# Patient Record
Sex: Female | Born: 1965 | Race: Black or African American | Hispanic: No | Marital: Married | Smoking: Never smoker
Health system: Southern US, Community
[De-identification: ages and names within clinical notes are randomized; demographics above are authoritative.]

## PROBLEM LIST (undated history)

## (undated) DIAGNOSIS — D573 Sickle-cell trait: Secondary | ICD-10-CM

## (undated) DIAGNOSIS — L309 Dermatitis, unspecified: Secondary | ICD-10-CM

## (undated) DIAGNOSIS — I639 Cerebral infarction, unspecified: Secondary | ICD-10-CM

## (undated) HISTORY — DX: Sickle-cell trait: D57.3

## (undated) HISTORY — DX: Dermatitis, unspecified: L30.9

---

## 1997-10-11 ENCOUNTER — Inpatient Hospital Stay (HOSPITAL_COMMUNITY): Admission: AD | Admit: 1997-10-11 | Discharge: 1997-10-13 | Payer: Self-pay | Admitting: *Deleted

## 1998-03-27 ENCOUNTER — Encounter: Admission: RE | Admit: 1998-03-27 | Discharge: 1998-03-27 | Payer: Self-pay | Admitting: Family Medicine

## 1998-03-29 ENCOUNTER — Encounter: Admission: RE | Admit: 1998-03-29 | Discharge: 1998-03-29 | Payer: Self-pay | Admitting: Family Medicine

## 1998-04-11 ENCOUNTER — Encounter: Admission: RE | Admit: 1998-04-11 | Discharge: 1998-04-11 | Payer: Self-pay | Admitting: Family Medicine

## 1998-10-19 ENCOUNTER — Encounter: Admission: RE | Admit: 1998-10-19 | Discharge: 1998-10-19 | Payer: Self-pay | Admitting: Family Medicine

## 1999-04-10 ENCOUNTER — Encounter: Admission: RE | Admit: 1999-04-10 | Discharge: 1999-04-10 | Payer: Self-pay | Admitting: Sports Medicine

## 1999-06-04 HISTORY — PX: CHOLECYSTECTOMY: SHX55

## 2000-06-10 ENCOUNTER — Encounter: Admission: RE | Admit: 2000-06-10 | Discharge: 2000-06-10 | Payer: Self-pay | Admitting: Family Medicine

## 2000-07-28 ENCOUNTER — Encounter: Payer: Self-pay | Admitting: Emergency Medicine

## 2000-07-28 ENCOUNTER — Emergency Department (HOSPITAL_COMMUNITY): Admission: EM | Admit: 2000-07-28 | Discharge: 2000-07-28 | Payer: Self-pay | Admitting: Emergency Medicine

## 2000-07-31 ENCOUNTER — Encounter (INDEPENDENT_AMBULATORY_CARE_PROVIDER_SITE_OTHER): Payer: Self-pay | Admitting: Specialist

## 2000-08-01 ENCOUNTER — Inpatient Hospital Stay (HOSPITAL_COMMUNITY): Admission: RE | Admit: 2000-08-01 | Discharge: 2000-08-02 | Payer: Self-pay | Admitting: Surgery

## 2003-03-24 ENCOUNTER — Ambulatory Visit (HOSPITAL_COMMUNITY): Admission: RE | Admit: 2003-03-24 | Discharge: 2003-03-24 | Payer: Self-pay | Admitting: *Deleted

## 2004-10-10 ENCOUNTER — Ambulatory Visit (HOSPITAL_COMMUNITY): Admission: RE | Admit: 2004-10-10 | Discharge: 2004-10-10 | Payer: Self-pay | Admitting: Family Medicine

## 2004-10-10 ENCOUNTER — Ambulatory Visit: Payer: Self-pay | Admitting: Family Medicine

## 2005-11-22 ENCOUNTER — Emergency Department (HOSPITAL_COMMUNITY): Admission: EM | Admit: 2005-11-22 | Discharge: 2005-11-22 | Payer: Self-pay | Admitting: Emergency Medicine

## 2005-12-06 ENCOUNTER — Ambulatory Visit: Payer: Self-pay | Admitting: Family Medicine

## 2005-12-10 ENCOUNTER — Encounter: Admission: RE | Admit: 2005-12-10 | Discharge: 2005-12-10 | Payer: Self-pay | Admitting: Sports Medicine

## 2006-02-10 ENCOUNTER — Ambulatory Visit: Payer: Self-pay | Admitting: Family Medicine

## 2006-02-24 ENCOUNTER — Encounter: Admission: RE | Admit: 2006-02-24 | Discharge: 2006-03-17 | Payer: Self-pay | Admitting: Family Medicine

## 2006-07-31 DIAGNOSIS — L309 Dermatitis, unspecified: Secondary | ICD-10-CM

## 2006-08-06 ENCOUNTER — Telehealth: Payer: Self-pay | Admitting: *Deleted

## 2006-08-07 ENCOUNTER — Ambulatory Visit: Payer: Self-pay | Admitting: Family Medicine

## 2007-09-08 ENCOUNTER — Encounter: Admission: RE | Admit: 2007-09-08 | Discharge: 2007-09-08 | Payer: Self-pay | Admitting: Internal Medicine

## 2007-09-24 ENCOUNTER — Encounter: Admission: RE | Admit: 2007-09-24 | Discharge: 2007-09-24 | Payer: Self-pay | Admitting: Internal Medicine

## 2009-01-19 ENCOUNTER — Ambulatory Visit (HOSPITAL_COMMUNITY): Admission: RE | Admit: 2009-01-19 | Discharge: 2009-01-19 | Payer: Self-pay | Admitting: Family Medicine

## 2009-01-19 ENCOUNTER — Ambulatory Visit: Payer: Self-pay | Admitting: Family Medicine

## 2009-01-19 ENCOUNTER — Encounter: Payer: Self-pay | Admitting: Family Medicine

## 2009-01-19 DIAGNOSIS — R002 Palpitations: Secondary | ICD-10-CM | POA: Insufficient documentation

## 2009-01-31 ENCOUNTER — Telehealth: Payer: Self-pay | Admitting: Family Medicine

## 2009-02-01 ENCOUNTER — Ambulatory Visit: Payer: Self-pay | Admitting: Family Medicine

## 2009-02-02 ENCOUNTER — Ambulatory Visit: Payer: Self-pay | Admitting: Family Medicine

## 2009-02-02 DIAGNOSIS — E669 Obesity, unspecified: Secondary | ICD-10-CM | POA: Insufficient documentation

## 2009-02-02 DIAGNOSIS — D509 Iron deficiency anemia, unspecified: Secondary | ICD-10-CM | POA: Insufficient documentation

## 2009-05-11 ENCOUNTER — Ambulatory Visit: Payer: Self-pay | Admitting: Family Medicine

## 2009-05-25 ENCOUNTER — Encounter: Payer: Self-pay | Admitting: Family Medicine

## 2009-08-17 ENCOUNTER — Telehealth: Payer: Self-pay | Admitting: Family Medicine

## 2009-10-25 ENCOUNTER — Encounter: Payer: Self-pay | Admitting: Family Medicine

## 2009-11-01 ENCOUNTER — Encounter: Payer: Self-pay | Admitting: Family Medicine

## 2009-12-28 ENCOUNTER — Ambulatory Visit: Payer: Self-pay | Admitting: Family Medicine

## 2009-12-28 ENCOUNTER — Encounter: Payer: Self-pay | Admitting: Family Medicine

## 2009-12-28 ENCOUNTER — Ambulatory Visit (HOSPITAL_COMMUNITY): Admission: RE | Admit: 2009-12-28 | Discharge: 2009-12-28 | Payer: Self-pay | Admitting: Family Medicine

## 2010-07-01 LAB — CONVERTED CEMR LAB
BUN: 7 mg/dL (ref 6–23)
CO2: 24 meq/L (ref 19–32)
Calcium: 9 mg/dL (ref 8.4–10.5)
Chloride: 107 meq/L (ref 96–112)
Creatinine, Ser: 0.65 mg/dL (ref 0.40–1.20)
Glucose, Bld: 88 mg/dL (ref 70–99)
HCT: 33.4 % — ABNORMAL LOW (ref 36.0–46.0)
Hemoglobin: 10.6 g/dL — ABNORMAL LOW (ref 12.0–15.0)
MCHC: 31.7 g/dL (ref 30.0–36.0)
MCV: 70 fL — ABNORMAL LOW (ref 78.0–100.0)
Platelets: 313 10*3/uL (ref 150–400)
Potassium: 3.8 meq/L (ref 3.5–5.3)
RBC: 4.77 M/uL (ref 3.87–5.11)
RDW: 16.3 % — ABNORMAL HIGH (ref 11.5–15.5)
Sodium: 141 meq/L (ref 135–145)
TSH: 2.336 microintl units/mL (ref 0.350–4.500)
WBC: 6.9 10*3/uL (ref 4.0–10.5)

## 2010-07-05 NOTE — Assessment & Plan Note (Signed)
Summary: heart flutters,df   Vital Signs:  Patient profile:   45 year old female Height:      63 inches Weight:      247 pounds BMI:     43.91 Temp:     98.2 degrees F oral Pulse rate:   88 / minute BP sitting:   152 / 79  (left arm) Cuff size:   regular  Vitals Entered By: Jimmy Footman, CMA (December 28, 2009 3:45 PM) CC: heart flutters Is Patient Diabetic? No Pain Assessment Patient in pain? no        Primary Provider:  Helane Rima MD  CC:  heart flutters.  History of Present Illness: CC: 1) pre-op evaluation for oral surgery tomorrow, Friday 12/29/09, 2) heart flutters, 3) CP  HPI:  Pt has multiple complaints today.  She is mostly concerned about being cleared for oral surgery scheduled for tomorrow at [facility].  She has been experiencing heart palpitations that began one month ago, lasts 2-3 seconds, and resolves after sitting down.  Describes the flutter as a "wave" like sensation.  She does drink sodas regularly.  CP began one month ago and has occurred  ~3x in past month.  Located in L sternal border.  Pain is worse with exertion and walking up stairs.  The pain is sharp and lasts about 3-4 seconds.  Radiates to lower back and stomach.  Does not occur after eating large meals and denies reflux.  Pt has recently been seen by an optometrist and was dx with papillary edema and increased ICP.  Pt wants to know if her CP is related to her papillary edema.  ROS:  Positive for chest pain, SOB on exertion.  Negative for HA, nausea/vomiting, diaphoresis.  Negative for HA, cough, or fever.  Habits & Providers  Alcohol-Tobacco-Diet     Tobacco Status: never  Problems Prior to Update: 1)  Screening For Malignant Neoplasm of The Cervix  (ICD-V76.2) 2)  Obesity, Unspecified  (ICD-278.00) 3)  Contraceptive Management  (ICD-V25.09) 4)  Anemia  (ICD-285.9) 5)  Palpitations  (ICD-785.1) 6)  Eczema, Atopic Dermatitis  (ICD-691.8)  Current Problems (verified): 1)  Screening For  Malignant Neoplasm of The Cervix  (ICD-V76.2) 2)  Obesity, Unspecified  (ICD-278.00) 3)  Contraceptive Management  (ICD-V25.09) 4)  Anemia  (ICD-285.9) 5)  Palpitations  (ICD-785.1) 6)  Eczema, Atopic Dermatitis  (ICD-691.8)  Medications Prior to Update: 1)  Triamcinolone Acetonide 0.1 % Crea (Triamcinolone Acetonide) .... Apply To Aa Two Times A Day As Needed Eczema. Large Op 2)  Ferrous Sulfate 325 (65 Fe) Mg  Tbec (Ferrous Sulfate) .... Two Times A Day 3)  Colace 100 Mg Caps (Docusate Sodium) .Marland Kitchen.. 1 By Mouth 2 Times Daily As Needed For Constipation  Current Medications (verified): 1)  Triamcinolone Acetonide 0.1 % Crea (Triamcinolone Acetonide) .... Apply To Aa Two Times A Day As Needed Eczema. Large Op 2)  Ferrous Sulfate 325 (65 Fe) Mg  Tbec (Ferrous Sulfate) .... Two Times A Day  Allergies (verified): No Known Drug Allergies  Review of Systems       per hpi  Physical Exam  Eyes:  No corneal or conjunctival inflammation noted. EOMI. Perrla. Funduscopic exam benign.  No papilledema seen today. Lungs:  Normal respiratory effort, chest expands symmetrically. Lungs are clear to auscultation, no crackles or wheezes. Heart:  Normal rate and regular rhythm. S1 and S2 normal without gallop, murmur, click, rub or other extra sounds.   Impression & Recommendations:  Problem #  1:  PALPITATIONS (ICD-785.1) Assessment Unchanged Obtained an EKG for both heart palpitations and surgical clearance.  Discussed results of EKG with patient -- NSR with no q waves, no ST depression or elevaton.  Gave pt a copy of EKG to bring to surgery tomorrow.  Advised pt to cut back on caffeine intake which is a common cause of heart flutters.  Advised patient to return to clinic or follow up with PCP if palpitations become more frequent or become worse.  Precepted with Dr. Cathey Endow who recommended starting a beta blocker to help control her palpitations and elevated blood pressure.   BP reading today:  152/79  Orders: 12 Lead EKG (12 Lead EKG) [facility]- Est Level  3 (16109)  Problem # 2:  CHEST PAIN, ATYPICAL (ICD-786.59) Assessment: New Discussed with patient that her CP is atypical and does not appear to be life-threatening.  Obtained EKG which was NSR and did not show any ST changes (see Assessment #1).  Advised pt of red flags for heart attack -- excessive sweating, sharp pain that lasts longer than a few minutes, radiates to jaw and shoulder, associated with N/V -- and if she experiences these symptoms, to immediately go to ED.  Pt understood and will follow-up with PCP regarding CP and newly diagnosed papillary edema.  Will need to fasting labwork done prior to next CPE.  Orders: [facility]- Est Level  3 (60454)  Problem # 3:  Preventive Health Care (ICD-V70.0) Assessment: Comment Only  Orders: [facility]- Est Level  3 (09811)  Complete Medication List: 1)  Triamcinolone Acetonide 0.1 % Crea (Triamcinolone acetonide) .... Apply to aa two times a day as needed eczema. large op 2)  Ferrous Sulfate 325 (65 Fe) Mg Tbec (Ferrous sulfate) .... Two times a day  Other Orders: EKG- [facility] (EKG)  Patient Instructions: 1)  Please lower caffeine intake to help reduce heart palpitations.  If symptoms become worse, please return to clinic. 2)  Please follow up with PCP to re-evaluate blood pressure, chest discomfort, and palpitations. 3)  Please see laboratory to get fasting blood work done before your next appointment. 4)  If you experience chest pain that occurs with sweating, pain that radiates to your jaw or arms, nausea or vomiting, please go to the Emergency Dept.

## 2010-07-05 NOTE — Assessment & Plan Note (Signed)
Summary: f/up,tcb   Vital Signs:  Patient profile:   45 year old female Height:      63 inches Weight:      232.4 pounds BMI:     41.32 Pulse rate:   68 / minute BP sitting:   110 / 70  (right arm)  Vitals Entered By: Arlyss Repress CMA, (February 02, 2009 1:45 PM) CC: f/up headache. feels fine today. refill triamcinolone cream Is Patient Diabetic? No Pain Assessment Patient in pain? no        Primary Care Provider:  Helane Rima MD  CC:  f/up headache. feels fine today. refill triamcinolone cream.  History of Present Illness: 45 year old AAF   1. Atopic Dermatitis: controlled with triamcinolone cream, requests refill. uses sparingly.   2. Anemia: since childhood per patient. Hgb 10.6, mcv 70 last month. not taking iron supplements. menses described as very light, LMP 7/9. has IUD. denies melena, BRBPR. diet full of greens, she does not eat much red meat. she endorses fatigue, intermittent palpitations and lightheadedness.  3. Contraception: states that IUD overdue to be taken out. scheduled visit for 9/10 to remove.  4. Health Maintenance: last mammogram spring of this year, [facility]'s [facility]ogram and have records faxed to [facility]. Last pap done last year, this is due. Had recent BMP, CBC. Will make future order for FLP.   5. HA: seen yesterday for HA. today, resolved. the patient believes that it is due to known impacted L mandibular wisdom tooth. pain controlled with Aleve, but the patient wanted to make sure that nothing else was causing the pain.  Habits & Providers  Alcohol-Tobacco-Diet     Tobacco Status: never  Current Medications (verified): 1)  Triamcinolone Acetonide 0.1 % Crea (Triamcinolone Acetonide) .... Apply To Aa Two Times A Day As Needed Eczema. Large Op 2)  Ferrous Sulfate 325 (65 Fe) Mg  Tbec (Ferrous Sulfate) .... Two Times A Day 3)  Colace 100 Mg Caps (Docusate Sodium) .Marland Kitchen.. 1 By Mouth 2 Times Daily As Needed  For Constipation  Allergies (verified): No Known Drug Allergies  Review of Systems       per HPI, otherwise negative  Physical Exam  General:  Well-developed, well-nourished, in no acute distress; alert, appropriate and cooperative throughout examination. Vital signs reviewed. Lungs:  Normal respiratory effort, chest expands symmetrically. Lungs are clear to auscultation, no crackles or wheezes. Heart:  Normal rate and regular rhythm. S1 and S2 normal without gallop, murmur, click, rub or other extra sounds. Pulses:  2 + dp. Extremities:  No edema. Skin:  No eczema rashes identified today.   Impression & Recommendations:  Problem # 1:  ECZEMA, ATOPIC DERMATITIS (ICD-691.8) Assessment Improved  Controlled. Will refill medication.  Her updated medication list for this problem includes:    Triamcinolone Acetonide 0.1 % Crea (Triamcinolone acetonide) .Marland Kitchen... Apply to aa two times a day as needed eczema. large op  Orders: [facility]- Est Level  3 (16109)  Problem # 2:  ANEMIA (ICD-285.9) Assessment: New  Rx FeSO4 supplements. No overt bleed identified. Will follow closely.  Her updated medication list for this problem includes:    Ferrous Sulfate 325 (65 Fe) Mg Tbec (Ferrous sulfate) .Marland Kitchen..Marland Kitchen Two times a day  Orders: [facility]- Est Level  3 (60454)  Problem # 3:  CONTRACEPTIVE MANAGEMENT (ICD-V25.09)  Will remove IUD at next visit.  Orders: [facility]- Est Level  3 (09811)  Problem # 4:  Preventive  Health Care (ICD-V70.0) FLP at next visit. Patient advised to come fasting.  Complete Medication List: 1)  Triamcinolone Acetonide 0.1 % Crea (Triamcinolone acetonide) .... Apply to aa two times a day as needed eczema. large op 2)  Ferrous Sulfate 325 (65 Fe) Mg Tbec (Ferrous sulfate) .... Two times a day 3)  Colace 100 Mg Caps (Docusate sodium) .Marland Kitchen.. 1 by mouth 2 times daily as needed for constipation  Other Orders: T-Lipid Profile ([id number]) Future Orders: Lipid-[facility] ([id number]) ...  02/09/2010  Patient Instructions: 1)  It was very nice to meet you today! 2)  I'll see you on the 10th! 3)  Sometimes, iron supplements can cause constipation, so I recommend colace as needed for constipation. Of course, eat lots of fruits and vegetables as well! Prescriptions: COLACE 100 MG CAPS (DOCUSATE SODIUM) 1 by mouth 2 times daily as needed for constipation  #180 x 3   Entered and Authorized by:   Helane Rima MD   Signed by:   Helane Rima MD on 02/02/2009   Method used:   Print then Give to Patient   RxID:   [id number] FERROUS SULFATE 325 (65 FE) MG  TBEC (FERROUS SULFATE) two times a day  #200 x 3   Entered and Authorized by:   Helane Rima MD   Signed by:   Helane Rima MD on 02/02/2009   Method used:   Print then Give to Patient   RxID:   [id number] TRIAMCINOLONE ACETONIDE 0.1 % CREA (TRIAMCINOLONE ACETONIDE) apply to AA two times a day as needed eczema. large OP  #1 large tub x 2   Entered and Authorized by:   Helane Rima MD   Signed by:   Helane Rima MD on 02/02/2009   Method used:   Print then Give to Patient   RxID:   [id number] TRIAMCINOLONE ACETONIDE 0.1 % CREA (TRIAMCINOLONE ACETONIDE) apply to AA two times a day as needed eczema. large OP  #1 large tub x 2   Entered and Authorized by:   Helane Rima MD   Signed by:   Helane Rima MD on 02/02/2009   Method used:   Electronically to        [facility]  [address] [facility] [id number] (retail)       [address] [place]  [address]       [place], [place]  [address]       Ph: [phone] or [phone]       Fax: [phone]   RxID:   [id number] COLACE 100 MG CAPS (DOCUSATE SODIUM) 1 by mouth 2 times daily as needed for constipation  #180 x 3   Entered and Authorized by:   Helane Rima MD   Signed by:   Helane Rima MD on 02/02/2009   Method used:   Electronically to        [facility]  [id number] (retail)       [address] [place]  [address]        [place], [place]  [address]       Ph: [phone] or [phone]       Fax: [phone]   RxID:   [id number] FERROUS SULFATE 325 (65 FE) MG  TBEC (FERROUS SULFATE) two times a day  #200 x 3   Entered and Authorized by:   Helane Rima MD   Signed by:   Helane Rima MD on 02/02/2009  Method used:   Electronically to        [facility]  [address] [facility] [id number] (retail)       [address] [place]  [address]       [place], [place]  [address]       Ph: [phone] or [phone]       Fax: [phone]   RxID:   [id number]   Prevention & Chronic Care Immunizations   Influenza vaccine: Not documented    Tetanus booster: Not documented    Pneumococcal vaccine: Not documented  Other Screening   Pap smear: Not documented   Pap smear action/deferral: Ordered  (02/02/2009)    Mammogram: Not documented   Mammogram action/deferral: Not indicated  (02/02/2009)   Smoking status: never  (02/02/2009)  Lipids   Total Cholesterol: Not documented   Lipid panel action/deferral: Lipid Panel ordered   LDL: Not documented   LDL Direct: Not documented   HDL: Not documented   Triglycerides: Not documented

## 2010-07-05 NOTE — Letter (Signed)
Summary: Results Letter  [facility]  [address]   [place], [place] [address]   Phone: [phone]  Fax: [phone]    05/25/2009  ADIAH GUERECA [address] [place], [place]  [address]  Dear Ms. Methvin,  I am happy to report that your recent PAP was normal.  Sincerely,   Helane Rima DO  Appended Document: Results Letter mailed.

## 2010-07-05 NOTE — Progress Notes (Signed)
Summary: re: mammogram/TS   Phone Note Call from Patient Call back at Home Phone [phone]   Caller: Patient Summary of Call: Pt wondering if she needs a mammorgram. Initial call taken by: Clydell Hakim,  August 17, 2009 12:00 PM  Follow-up for Phone Call        called pt. pt had mammogram 2-11 at [facility]. Wants to know, if Dr.Ronaldo Crilly had looked at the mammogram report. I told the pt, that she can sched. her mammogram at the place of her choice. Pt prefers [facility]. Number given to pt to sched. and also advised, that her old films should be send to [facility] to compare. pt agreed. fwd. to Dr.Chasitie Passey for review Follow-up by: Arlyss Repress CMA,,  August 17, 2009 4:09 PM

## 2010-07-05 NOTE — Miscellaneous (Signed)
Summary: Mammagram   Clinical Lists Changes Ms. Rumbold requests the results received from [facility] to [facility]. Abundio Miu  Oct 25, 2009 3:05 PM  Faxed copy of results per pt request to the [facility].
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
## 2010-07-05 NOTE — Progress Notes (Signed)
Summary: triage   Phone Note Call from Patient Call back at Home Phone [phone]   Caller: Patient Summary of Call: pt is having very bad headaches/migrains.  wants to come in before thursday. Initial call taken by: De Nurse,  January 31, 2009 2:23 PM  Follow-up for Phone Call        taking ibuprofen with little relief. appt tomorrow at 8:30 in work in. told her to keep later appt in week as she had other issues to discuss. told her tomorrow was for HA only. also wanted a PE & DTAP for her 45 yr old. she has not been here in over 3 yrs. told her we can take her but exams for a new pt are 3-4 weeks out. she needs this now for school. she will take her to [facility] on [address] which is where she has been going. Follow-up by: Golden Circle RN,  January 31, 2009 2:24 PM

## 2010-07-10 ENCOUNTER — Encounter: Payer: Self-pay | Admitting: *Deleted

## 2010-09-21 ENCOUNTER — Inpatient Hospital Stay (INDEPENDENT_AMBULATORY_CARE_PROVIDER_SITE_OTHER)
Admission: RE | Admit: 2010-09-21 | Discharge: 2010-09-21 | Disposition: A | Discharge status: Home / Self Care | Payer: Self-pay | Source: Ambulatory Visit | Attending: Family Medicine | Admitting: Family Medicine

## 2010-09-21 DIAGNOSIS — R6889 Other general symptoms and signs: Secondary | ICD-10-CM

## 2010-10-19 NOTE — Op Note (Signed)
[facility]  Patient:    Whitney Rose, Whitney Rose                       MRN: [id number] Proc. Date: 07/31/00 Adm. Date:  [id number] Attending:  Abigail Miyamoto A                           Operative Report  PREOPERATIVE DIAGNOSIS:   Symptomatic cholelithiasis.  POSTOPERATIVE DIAGNOSIS:  Symptomatic cholelithiasis.  OPERATION:  Laparoscopic cholecystectomy.  SURGEON: otherwise Abigail Miyamoto, M.D.  ASSISTANT:  Rose Phi. Maple Hudson, M.D.  ANESTHESIA:  General endotracheal.  ESTIMATED BLOOD LOSS:  Minimal.  DESCRIPTION OF PROCEDURE:  The patient was brought to the operating room and identified as Whitney Rose.  She was placed supine on the operating table. General anesthesia was induced.  Her abdomen was then prepped and draped in the usual sterile fashion.  Using the 15 blade, a small transverse incision was made below the umbilicus.  Incision was carried down to the fascia which was then opened with the scalpel including the small umbilical hernia defect. Next, a hemostat was used to pass through the peritoneal cavity.  A 0 Vicryl pursestring suture was then passed around the fascia opening.  The Hasson port was then placed in the opening and insufflation of the abdomen was begun. Next, an 11 mm port was placed in the patients epigastrium and two 5 mm ports were placed in the patients right flank all under direct vision.  The gallbladder was then identified and found to be quite distended.  At this point, incision was made to needle aspirate bile from the gallbladder which facilitated the ability to grasp the gallbladder.  Several adhesions from the liver surface to the abdominal wall were then taken down with the scissors. The gallbladder was then grasped and retracted above the liver bed. Dissection was then carried out at the base of the gallbladder.  The cystic duct was dissected out and clipped three times proximally and once distally and transected with  scissors.  The cystic artery and a branch were then dissected out and clipped twice proximally and once distally and transected as well.  The gallbladder was then slowly dissected free from the liver bed with the electrocautery.  Hemostasis was then appeared to be achieved in the liver bed.  A small piece of Surgicel was placed on the lower aspect of the liver bed.  Once the gallbladder was removed, it was placed in an Endosac and attempt made to pull it out with the umbilicus.  Several stones had to be moved from the wall of the gallbladder due to their size and the fascia at the umbilicus had to be opened somewhat further.  Once the gallbladder was completely removed, the 0 Vicryl at the umbilicus was then tied in place.  A separated suture was also placed at the fascia at the umbilicus.  The abdomen was again then irrigated with normal saline.  Hemostasis appeared to be achieved.  All ports were then removed under direct vision and the abdomen was deflated.  All incisions were then anesthetized with 0.25% Marcaine and then closed with 4-0 Monocryl subcuticular suture.  Steri-Strips, gauze and tape were then applied.  The patient tolerated the procedure well.  All sponge, needle and instrument counts were correct at the end of the procedure.  The patient was then extubated in the operating room and taken in stable condition to the recovery  room. DD:  07/31/00 TD:  07/31/00 Job: [id number]

## 2011-10-23 ENCOUNTER — Other Ambulatory Visit (HOSPITAL_COMMUNITY)
Admission: RE | Admit: 2011-10-23 | Discharge: 2011-10-23 | Disposition: A | Discharge status: Home / Self Care | Payer: 59 | Source: Ambulatory Visit | Attending: Family Medicine | Admitting: Family Medicine

## 2011-10-23 ENCOUNTER — Ambulatory Visit (INDEPENDENT_AMBULATORY_CARE_PROVIDER_SITE_OTHER): Payer: 59 | Admitting: Family Medicine

## 2011-10-23 ENCOUNTER — Encounter: Payer: Self-pay | Admitting: Family Medicine

## 2011-10-23 VITALS — BP 132/76 | HR 80 | Temp 98.4°F | Wt 256.0 lb

## 2011-10-23 DIAGNOSIS — M722 Plantar fascial fibromatosis: Secondary | ICD-10-CM | POA: Insufficient documentation

## 2011-10-23 DIAGNOSIS — L2089 Other atopic dermatitis: Secondary | ICD-10-CM

## 2011-10-23 DIAGNOSIS — Z0000 Encounter for general adult medical examination without abnormal findings: Secondary | ICD-10-CM

## 2011-10-23 DIAGNOSIS — Z01419 Encounter for gynecological examination (general) (routine) without abnormal findings: Secondary | ICD-10-CM | POA: Insufficient documentation

## 2011-10-23 DIAGNOSIS — D649 Anemia, unspecified: Secondary | ICD-10-CM

## 2011-10-23 DIAGNOSIS — E669 Obesity, unspecified: Secondary | ICD-10-CM

## 2011-10-23 MED ORDER — TRIAMCINOLONE ACETONIDE 0.1 % EX CREA: TOPICAL_CREAM | Freq: Two times a day (BID) | CUTANEOUS | Status: DC

## 2011-10-23 NOTE — Progress Notes (Signed)
Patient ID: Whitney Rose, female   DOB: Feb 08, 1966, 46 y.o.   MRN: [id number] Patient ID: Whitney Rose    DOB: March 22, 1966, 46 y.o.   MRN: [id number] --- Subjective:  Whitney Rose is a 46 y.o.female who presents for a routine physical exam. Her concerns include the following: - Lower extremity edema:  Noticed it 3 weeks ago, swelling after walking or standing on her feet for an extended period of time. Swelling completely improves after putting feet up. Denies any shortness of breath with activity or at rest. Denies chest pain. Denies any orthopnea or paroxysmal nocturnal dyspnea.  - Eczema: Under arms behind neck. This has been present for several years. The eczema under her arms have prevented her from using normal deodorant. Has used triamcinolone and hydrating creams in the past with good results. Denies any redness, warmth.  has some itching under arms. - Foot plantar fasciitis: In both feet. Has had it for several years. Has required injections in the past, 2 years ago. Worst with walking, worst in the morning. Has not taken any medications for it in the past.  - Would also like to be able to exercise and wants to be cleared for it. Denies any previous history of heart problems except palpitations which were due to over caffeine use. No problems since. Denies chest pain, shortness of breath. Denies any family history of sudden cardiac death or any history of MI's. No problems with joints or legs.  Gyn History:  Denies any family history of gun cancers Regular period, every 28 days, 4 days long, normal flow, Patient's last menstrual period was 10/10/2011. 5 kids, vaginal births, no c-section Last 4 years have been normal. Reports an abnormal pap when she was in her 46's. No history of STD's.  ROS: see HPI Past Medical History: reviewed and updated medications and allergies. Social History: Tobacco: denies tobacco or alcohol use.   Objective: Filed Vitals:   10/23/11 1558  BP: 132/76  Pulse:  80  Temp: 98.4 F (36.9 C)    Physical Examination:   General appearance - alert, well appearing, and in no distress Ears - bilateral TM's and external canal erythematous bilaterally, no tenderness with inspection or around external ear.  Nose - normal and patent, no erythema, discharge or polyps Mouth - mucous membranes moist, pharynx normal without lesions Neck - supple, no significant adenopathy Chest - clear to auscultation, no wheezes, rales or rhonchi, symmetric air entry Heart - normal rate, regular rhythm, normal S1, S2, no murmurs Abdomen - soft, nontender, nondistended, no masses or organomegaly Extremities - peripheral pulses normal, trace pedal edema.  Pelvic exam: normal external genitalia, vulva, vagina, cervix, uterus and adnexa. Scant bleeding occurred with sample collection.  Skin - axilla: scales and poorly defined papules. No erythema, no warmth. No evidence of infection.

## 2011-10-23 NOTE — Assessment & Plan Note (Signed)
Check fasting lipid panel. Will also check fasting glucose. Check electrolytes and LFT's.

## 2011-10-23 NOTE — Assessment & Plan Note (Addendum)
Will refill triamcinolone cream which she uses with hydrating cream. No evidence of infection on exam.

## 2011-10-23 NOTE — Assessment & Plan Note (Signed)
Mammogram information given.  PAP smear obtained since last one was in 2011.

## 2011-10-23 NOTE — Patient Instructions (Signed)
It was very nice to meet you today! For the edema, I don't think it's related to your heart. It seems to be more gravity dependent. Continue keeping your feet up and if you need it, you can try compression stockings.   For the exercising, I don't see any reason not to. If you start having shortness of breath, chest pain, stop exercising and come back to clinic.   For the mammogram, I am giving you a card to call and make an appointment.   For the pap smear results, I will send you a letter in the mail with the results.   Please come back fasting to check your lipid panel, your hemoglobin and your electrolytes and sugar.  I will also send in the referral for the foot center.   I resent a prescription for triamcinolone.

## 2011-10-23 NOTE — Assessment & Plan Note (Signed)
Repeat CBC. Patient not currently taking iron supplements.

## 2011-10-25 ENCOUNTER — Other Ambulatory Visit: Payer: 59

## 2011-10-25 DIAGNOSIS — M722 Plantar fascial fibromatosis: Secondary | ICD-10-CM

## 2011-10-25 LAB — COMPREHENSIVE METABOLIC PANEL
AST: 17 U/L (ref 0–37)
Alkaline Phosphatase: 80 U/L (ref 39–117)
BUN: 10 mg/dL (ref 6–23)
Calcium: 9.1 mg/dL (ref 8.4–10.5)
Chloride: 108 mEq/L (ref 96–112)
Creat: 0.68 mg/dL (ref 0.50–1.10)
Glucose, Bld: 81 mg/dL (ref 70–99)

## 2011-10-25 LAB — CBC WITH DIFFERENTIAL/PLATELET
Basophils Absolute: 0 10*3/uL (ref 0.0–0.1)
Basophils Relative: 0 % (ref 0–1)
Eosinophils Relative: 2 % (ref 0–5)
HCT: 33.7 % — ABNORMAL LOW (ref 36.0–46.0)
MCHC: 33.2 g/dL (ref 30.0–36.0)
MCV: 67.5 fL — ABNORMAL LOW (ref 78.0–100.0)
Monocytes Absolute: 0.5 10*3/uL (ref 0.1–1.0)
Monocytes Relative: 7 % (ref 3–12)
RDW: 16.2 % — ABNORMAL HIGH (ref 11.5–15.5)

## 2011-10-25 LAB — LIPID PANEL
Cholesterol: 142 mg/dL (ref 0–200)
HDL: 35 mg/dL — ABNORMAL LOW
LDL Cholesterol: 89 mg/dL (ref 0–99)
Total CHOL/HDL Ratio: 4.1 ratio
Triglycerides: 91 mg/dL
VLDL: 18 mg/dL (ref 0–40)

## 2011-10-25 NOTE — Progress Notes (Signed)
CMP,CBC WITH DIFF AND FLP DONE TODAY Whitney Rose 

## 2011-10-31 ENCOUNTER — Telehealth: Payer: Self-pay | Admitting: Family Medicine

## 2011-10-31 DIAGNOSIS — D649 Anemia, unspecified: Secondary | ICD-10-CM

## 2011-10-31 MED ORDER — FERROUS SULFATE 325 (65 FE) MG PO TABS: 325.0000 mg | ORAL_TABLET | Freq: Two times a day (BID) | ORAL | Status: AC

## 2011-10-31 NOTE — Telephone Encounter (Signed)
Left a message on patient's phone letting her know that her cholesterol, electrolytes and pap smear were normal. She did have evidence of anemia which she has had in the past and I will therefore send in a prescription for ferrous sulfate.

## 2013-12-20 ENCOUNTER — Ambulatory Visit (INDEPENDENT_AMBULATORY_CARE_PROVIDER_SITE_OTHER): Payer: Self-pay | Admitting: Emergency Medicine

## 2013-12-20 VITALS — BP 142/81 | HR 72 | Temp 98.2°F | Resp 16 | Ht 63.0 in | Wt 237.5 lb

## 2013-12-20 DIAGNOSIS — Z0289 Encounter for other administrative examinations: Secondary | ICD-10-CM

## 2013-12-20 NOTE — Progress Notes (Signed)
[facility] [address], [place] [id number] [phone]  Date:  12/20/2013   Name:  Whitney GlassmanKatoya Y Rose   DOB:  May 02, 1966   MRN:  [id number]  PCP:  Joanna Pufforsey, Crystal S, MD    Chief Complaint: Annual Exam   History of Present Illness:  Whitney Rose is a 48 y.o. very pleasant female patient who presents with the following:  preemployment examination.   No current medical problems   Patient Active Problem List   Diagnosis Date Noted  . Plantar fasciitis 10/23/2011  . Health care maintenance 10/23/2011  . OBESITY, UNSPECIFIED 02/02/2009  . ANEMIA 02/02/2009  . PALPITATIONS 01/19/2009  . ECZEMA, ATOPIC DERMATITIS 07/31/2006    Past Medical History  Diagnosis Date  . Sickle cell trait   . Eczema     Past Surgical History  Procedure Laterality Date  . Cholecystectomy  2001    History  Substance Use Topics  . Smoking status: Never Smoker   . Smokeless tobacco: Never Used  . Alcohol Use: No    Family History  Problem Relation Age of Onset  . Heart disease Mother     CHF  . COPD Mother     No Known Allergies  Medication list has been reviewed and updated.  Current Outpatient Prescriptions on File Prior to Visit  Medication Sig Dispense Refill  . triamcinolone cream (KENALOG) 0.1 % Apply topically 2 (two) times daily. To affected areas as needed  2268 g  1   No current facility-administered medications on file prior to visit.    Review of Systems:  As per HPI, otherwise negative.    Physical Examination: Filed Vitals:   12/20/13 2055  BP: 142/81  Pulse: 72  Temp: 98.2 F (36.8 C)  Resp: 16   Filed Vitals:   12/20/13 2055  Height: 5\' 3"  (1.6 m)  Weight: 237 lb 8 oz (107.729 kg)   Body mass index is 42.08 kg/(m^2). Ideal Body Weight: Weight in (lb) to have BMI = 25: 140.8  GEN: WDWN, NAD, Non-toxic, A & O x 3 HEENT: Atraumatic, Normocephalic. Neck supple. No masses, No LAD. Ears and Nose: No external deformity. CV:  RRR, No M/G/R. No JVD. No thrill. No extra heart sounds. PULM: CTA B, no wheezes, crackles, rhonchi. No retractions. No resp. distress. No accessory muscle use. ABD: S, NT, ND, +BS. No rebound. No HSM. EXTR: No c/c/e NEURO Normal gait.  PSYCH: Normally interactive. Conversant. Not depressed or anxious appearing.  Calm demeanor.    Assessment and Plan: Pre-employment  Physical Labs   Signed,  Phillips OdorJeffery Jayvian Escoe, MD

## 2013-12-22 LAB — MEASLES/MUMPS/RUBELLA IMMUNITY
Mumps IgG: >300 AU/mL — ABNORMAL HIGH (ref <9.00)
RUBELLA: 5.12 {index} — AB (ref <0.90)
Rubeola IgG: >300 AU/mL — ABNORMAL HIGH (ref <25.00)

## 2013-12-22 LAB — VARICELLA ZOSTER ANTIBODY, IGG: Varicella IgG: 1004 Index — ABNORMAL HIGH (ref <135.00)

## 2013-12-24 ENCOUNTER — Encounter: Payer: Self-pay | Admitting: Family Medicine

## 2014-11-15 ENCOUNTER — Ambulatory Visit: Payer: 59 | Attending: Internal Medicine

## 2014-12-09 ENCOUNTER — Ambulatory Visit: Payer: 59

## 2015-03-01 ENCOUNTER — Encounter (HOSPITAL_COMMUNITY): Payer: Self-pay | Admitting: *Deleted

## 2015-03-01 ENCOUNTER — Emergency Department (HOSPITAL_COMMUNITY): Payer: 59

## 2015-03-01 ENCOUNTER — Emergency Department (HOSPITAL_COMMUNITY)
Admission: EM | Admit: 2015-03-01 | Discharge: 2015-03-01 | Disposition: A | Discharge status: Home / Self Care | Payer: 59 | Attending: Emergency Medicine | Admitting: Emergency Medicine

## 2015-03-01 DIAGNOSIS — F419 Anxiety disorder, unspecified: Secondary | ICD-10-CM | POA: Insufficient documentation

## 2015-03-01 DIAGNOSIS — R002 Palpitations: Secondary | ICD-10-CM | POA: Insufficient documentation

## 2015-03-01 DIAGNOSIS — Z872 Personal history of diseases of the skin and subcutaneous tissue: Secondary | ICD-10-CM | POA: Insufficient documentation

## 2015-03-01 DIAGNOSIS — Z862 Personal history of diseases of the blood and blood-forming organs and certain disorders involving the immune mechanism: Secondary | ICD-10-CM | POA: Insufficient documentation

## 2015-03-01 DIAGNOSIS — Z8673 Personal history of transient ischemic attack (TIA), and cerebral infarction without residual deficits: Secondary | ICD-10-CM | POA: Insufficient documentation

## 2015-03-01 HISTORY — DX: Cerebral infarction, unspecified: I63.9

## 2015-03-01 LAB — BASIC METABOLIC PANEL
Anion gap: 7 (ref 5–15)
BUN: 6 mg/dL (ref 6–20)
CALCIUM: 8.7 mg/dL — AB (ref 8.9–10.3)
CHLORIDE: 105 mmol/L (ref 101–111)
CO2: 25 mmol/L (ref 22–32)
CREATININE: 0.69 mg/dL (ref 0.44–1.00)
GFR calc Af Amer: >60 mL/min (ref >60)
GFR calc non Af Amer: >60 mL/min (ref >60)
Glucose, Bld: 87 mg/dL (ref 65–99)
Potassium: 3.5 mmol/L (ref 3.5–5.1)
SODIUM: 137 mmol/L (ref 135–145)

## 2015-03-01 LAB — I-STAT TROPONIN, ED: TROPONIN I, POC: 0.02 ng/mL (ref 0.00–0.08)

## 2015-03-01 LAB — CBC
HCT: 32.3 % — ABNORMAL LOW (ref 36.0–46.0)
Hemoglobin: 10.5 g/dL — ABNORMAL LOW (ref 12.0–15.0)
MCH: 22.7 pg — AB (ref 26.0–34.0)
MCHC: 32.5 g/dL (ref 30.0–36.0)
MCV: 69.8 fL — AB (ref 78.0–100.0)
PLATELETS: 343 10*3/uL (ref 150–400)
RBC: 4.63 MIL/uL (ref 3.87–5.11)
RDW: 17.1 % — AB (ref 11.5–15.5)
WBC: 9 10*3/uL (ref 4.0–10.5)

## 2015-03-01 MED ORDER — TRIAMCINOLONE ACETONIDE 0.1 % EX CREA: TOPICAL_CREAM | Freq: Two times a day (BID) | CUTANEOUS | Status: DC

## 2015-03-01 MED ORDER — ATORVASTATIN CALCIUM 80 MG PO TABS: 80.0000 mg | ORAL_TABLET | Freq: Every day | ORAL | Status: DC

## 2015-03-01 NOTE — Discharge Instructions (Signed)
Continue ASA 81 mg daily.  Continue your other medicines.   See wellness clinic for follow up.  Return to ER if you have heart rate greater than 100, shortness of breath, chest pain.

## 2015-03-01 NOTE — ED Notes (Signed)
The pt is c/o an irregular heart beat for 3 days with a dullness in her chest  lmp  Sept 15.  Stroke in July  Residual lt finger tips only

## 2015-03-01 NOTE — ED Provider Notes (Signed)
CSN: [id number]     Arrival date & time 03/01/15  2151 History   First MD Initiated Contact with Patient 03/01/15 2201     Chief Complaint  Patient presents with  . Irregular Heart Beat     (Consider location/radiation/quality/duration/timing/severity/associated sxs/prior Treatment) The history is provided by the patient.  Whitney Rose is a 49 y.o. female hx of sickle cell trait, recent stroke here with irregular heartbeat. Has had intermittent palpitations for the last several days. States that it lasts for several minutes and goes away. Has some dull aches in her chest but denies any chest pain or shortness of breath. Patient was in [place] in early July and was admitted to [facility] for stroke. At that time, patient has left hand numbness as well as weakness the left arm. She had an admission with MRI that shows right sided stroke. She has a normal TTE and TEE and was started on aspirin, statin. No history of blood clots in the past. She had been workup in the past for palpitations but never found any causes. Of note, patient was noted to be slightly anemic with a hemoglobin around 10. She did have heavy menses about 2 weeks ago. But she is also taking iron and denies any dizziness or passing out.    Past Medical History  Diagnosis Date  . Sickle cell trait   . Eczema   . Stroke    Past Surgical History  Procedure Laterality Date  . Cholecystectomy  2001   Family History  Problem Relation Age of Onset  . Heart disease Mother     CHF  . COPD Mother    Social History  Substance Use Topics  . Smoking status: Never Smoker   . Smokeless tobacco: Never Used  . Alcohol Use: No   OB History    No data available     Review of Systems  Cardiovascular: Positive for palpitations.  All other systems reviewed and are negative.     Allergies  Review of patient's allergies indicates no known allergies.  Home Medications   Prior to Admission medications    Medication Sig Start Date End Date Taking? Authorizing Provider  triamcinolone cream (KENALOG) 0.1 % Apply topically 2 (two) times daily. To affected areas as needed 10/23/11   Lonia Skinner, MD   BP 124/45 mmHg  Pulse 73  Temp(Src) 98.5 F (36.9 C) (Oral)  Resp 18  SpO2 100%  LMP 02/17/2015 Physical Exam  Constitutional: She is oriented to person, place, and time. She appears well-developed and well-nourished.  Slightly anxious   HENT:  Head: Normocephalic.  Mouth/Throat: Oropharynx is clear and moist.  Eyes: Conjunctivae are normal. Pupils are equal, round, and reactive to light.  Neck: Normal range of motion. Neck supple.  Cardiovascular: Normal rate, regular rhythm and normal heart sounds.   Pulmonary/Chest: Effort normal and breath sounds normal. No respiratory distress. She has no wheezes. She has no rales.  Abdominal: Soft. Bowel sounds are normal. She exhibits no distension. There is no tenderness. There is no rebound.  Musculoskeletal: Normal range of motion. She exhibits no edema or tenderness.  Neurological: She is alert and oriented to person, place, and time. No cranial nerve deficit. Coordination normal.  Skin: Skin is warm and dry.  Psychiatric: She has a normal mood and affect. Her behavior is normal. Thought content normal.  Nursing note and vitals reviewed.   ED Course  Procedures (including critical care time) Labs Review Labs  Reviewed  BASIC METABOLIC PANEL - Abnormal; Notable for the following:    Calcium 8.7 (*)    All other components within normal limits  CBC - Abnormal; Notable for the following:    Hemoglobin 10.5 (*)    HCT 32.3 (*)    MCV 69.8 (*)    MCH 22.7 (*)    RDW 17.1 (*)    All other components within normal limits  Rosezena Sensor, ED    Imaging Review Dg Chest 2 View  03/01/2015   CLINICAL DATA:  Chest palpitations and discomfort.  EXAM: CHEST  2 VIEW  COMPARISON:  None.  FINDINGS: The mediastinal contour is normal. The heart  size is enlarged. There is no focal infiltrate, pulmonary edema, or pleural effusion. The visualized skeletal structures are unremarkable.  IMPRESSION: No active cardiopulmonary disease.   Electronically Signed   By: Sherian Rein M.D.   On: 03/01/2015 22:39   I have personally reviewed and evaluated these images and lab results as part of my medical decision-making.   EKG Interpretation   Date/Time:  Wednesday March 01 2015 21:58:39 EDT Ventricular Rate:  73 PR Interval:  152 QRS Duration: 90 QT Interval:  396 QTC Calculation: 436 R Axis:   35 Text Interpretation:  Normal sinus rhythm Normal ECG No significant change  since last tracing Confirmed by YAO  MD, DAVID ([id number]) on 03/01/2015  10:01:40 PM      MDM   Final diagnoses:  None   Whitney Rose is a 49 y.o. female here with palpitations. Not tachy in the ED and no shortness of breath. She did come back from [place] recently but has no signs of PE or DVT. Had recent echo that was unremarkable in [place]. Will check Hg to r/o symptomatic anemia. Will get CXR. Likely a component of anxiety.   11:25 PM Hg 10.5, improved compared to July. CXR nl. Trop neg. Had several episodes of palpitations in the ED but HR remained in the 60s and no obvious arrhythmias noted on the monitor. Able to get her follow up in the clinic. Stable for dc.    Richardean Canal, MD 03/01/15 2329

## 2015-03-09 ENCOUNTER — Encounter: Payer: Self-pay | Admitting: Family Medicine

## 2015-03-09 ENCOUNTER — Ambulatory Visit: Payer: 59 | Attending: Family Medicine | Admitting: Family Medicine

## 2015-03-09 VITALS — BP 146/80 | HR 68 | Temp 98.4°F | Resp 18 | Ht 63.0 in | Wt 246.0 lb

## 2015-03-09 DIAGNOSIS — I638 Other cerebral infarction: Secondary | ICD-10-CM | POA: Diagnosis not present

## 2015-03-09 DIAGNOSIS — IMO0001 Reserved for inherently not codable concepts without codable children: Secondary | ICD-10-CM

## 2015-03-09 DIAGNOSIS — R002 Palpitations: Secondary | ICD-10-CM

## 2015-03-09 DIAGNOSIS — L309 Dermatitis, unspecified: Secondary | ICD-10-CM

## 2015-03-09 DIAGNOSIS — R030 Elevated blood-pressure reading, without diagnosis of hypertension: Secondary | ICD-10-CM | POA: Diagnosis not present

## 2015-03-09 DIAGNOSIS — I6389 Other cerebral infarction: Secondary | ICD-10-CM

## 2015-03-09 DIAGNOSIS — Z8673 Personal history of transient ischemic attack (TIA), and cerebral infarction without residual deficits: Secondary | ICD-10-CM | POA: Insufficient documentation

## 2015-03-09 MED ORDER — ATORVASTATIN CALCIUM 80 MG PO TABS: 80.0000 mg | ORAL_TABLET | Freq: Every day | ORAL | Status: DC

## 2015-03-09 NOTE — Progress Notes (Signed)
Pt's here For ED f/up for for stroke and heart palpitations on July 3rd, of this year. Pt reports no pain today. Pt would like to est. care with PCP.

## 2015-03-09 NOTE — Patient Instructions (Signed)

## 2015-03-09 NOTE — Progress Notes (Signed)
CC:  HPI: Whitney Rose is a 49 y.o. female with a history of eczema, recent stroke (in 12/2014) who presents to the clinic today to establish care.  She had presented to the ED at [facility] a week ago with palpitations, vital signs were normal and chest x-ray was unremarkable. Her hemoglobin was 10.5 and troponins were negative; EKG was negative for arrhythmias and she was subsequently discharged and advised to follow-up here in the clinic.  She states palpitations are infrequent and inconsistent and not precipitated by any event. She had similar symptoms in [place] 3 months ago and wore a Holter monitor also 3 weeks with no recorded abnormal events. Of note she was diagnosed with a stroke in 12/2014 when she had presented with left-sided weakness and facial droop and MRI revealed right-sided stroke and she was admitted to [facility] where she was commenced on statin therapy as well as aspirin  Patient has No headache, No chest pain, No abdominal pain - No Nausea, No new weakness tingling or numbness, No Cough - SOB.  No Known Allergies Past Medical History  Diagnosis Date  . Sickle cell trait (HCC)   . Eczema   . Stroke [facility])    Current Outpatient Prescriptions on File Prior to Visit  Medication Sig Dispense Refill  . triamcinolone cream (KENALOG) 0.1 % Apply topically 2 (two) times daily. To affected areas as needed 453.6 g 1   No current facility-administered medications on file prior to visit.   Family History  Problem Relation Age of Onset  . Heart disease Mother     CHF  . COPD Mother    Social History   Social History  . Marital Status: Married    Spouse Name: N/A  . Number of Children: N/A  . Years of Education: N/A   Occupational History  . Not on file.   Social History Main Topics  . Smoking status: Never Smoker   . Smokeless tobacco: Never Used  . Alcohol Use: No  . Drug Use: No  . Sexual Activity: Yes   Other Topics Concern  . Not  on file   Social History Narrative    Review of Systems: Constitutional: Negative for fever, chills, diaphoresis, activity change, appetite change and fatigue. HENT: Negative for ear pain, nosebleeds, congestion, facial swelling, rhinorrhea, neck pain, neck stiffness and ear discharge.  Eyes: Negative for pain, discharge, redness, itching and visual disturbance. Respiratory: Negative for cough, choking, chest tightness, shortness of breath, wheezing and stridor.  Cardiovascular: Negative for chest pain, positive for palpitations and negative for leg swelling. Gastrointestinal: Negative for abdominal distention. Genitourinary: Negative for dysuria, urgency, frequency, hematuria, flank pain, decreased urine volume, difficulty urinating and dyspareunia.  Musculoskeletal: Negative for back pain, joint swelling, arthralgias and gait problem. Neurological: Negative for dizziness, tremors, seizures, syncope, facial asymmetry, speech difficulty, weakness, light-headedness, numbness and headaches.  Hematological: Negative for adenopathy. Does not bruise/bleed easily. Psychiatric/Behavioral: Negative for hallucinations, behavioral problems, confusion, dysphoric mood, decreased concentration and agitation.    Objective: Filed Vitals:   03/09/15 1148 03/09/15 1203  BP: 145/79 146/80  Pulse: 68   Temp: 98.4 F (36.9 C)   TempSrc: Oral   Resp: 18   Height:  (1.6 m)   Weight: 246 lb (111.585 kg)   SpO2: 97%         Physical Exam: Constitutional: Patient appears well-developed and well-nourished. No distress. HENT: Normocephalic, atraumatic, External right and left ear normal. Oropharynx is clear  and moist.  Eyes: Conjunctivae and EOM are normal. PERRLA, no scleral icterus. Neck: Normal ROM. Neck supple. No JVD. No tracheal deviation. No thyromegaly. CVS: RRR, S1/S2 +, no murmurs, no gallops, no carotid bruit.  Pulmonary: Effort and breath sounds normal, no stridor, rhonchi, wheezes,  rales.  Abdominal: Soft. BS +,  no distension, tenderness, rebound or guarding.  Musculoskeletal: Normal range of motion. No edema and no tenderness.  Lymphadenopathy: No lymphadenopathy noted, cervical, inguinal or axillary Neuro: Alert. Normal reflexes, muscle tone coordination. No cranial nerve deficit. Skin: Skin is warm and dry. No rash noted. Not diaphoretic. No erythema. No pallor. Psychiatric: Normal mood and affect. Behavior, judgment, thought content normal.  Lab Results  Component Value Date   WBC 9.0 03/01/2015   HGB 10.5* 03/01/2015   HCT 32.3* 03/01/2015   MCV 69.8* 03/01/2015   PLT 343 03/01/2015   Lab Results  Component Value Date   CREATININE 0.69 03/01/2015   BUN 6 03/01/2015   NA 137 03/01/2015   K 3.5 03/01/2015   CL 105 03/01/2015   CO2 25 03/01/2015    No results found for: HGBA1C Lipid Panel     Component Value Date/Time   CHOL 142 10/25/2011 1143   TRIG 91 10/25/2011 1143   HDL 35* 10/25/2011 1143   CHOLHDL 4.1 10/25/2011 1143   VLDL 18 10/25/2011 1143   LDLCALC 89 10/25/2011 1143       Assessment and plan:  49 year old female with a history of a eczema, stroke with intermittent episodes of palpitations; blood pressure is elevated with no known history of hypertension.  Previous history of stroke: No residual deficit. Aggressive risk factor modification. Continue Lipitor.  Eczema: Controlled on triamcinolone.  Elevated blood pressure: I have reviewed her previous blood pressures which have been normal. Advised on lifestyle changes including weight loss, low-sodium and DASH diet and her blood pressure will be reassessed at her next office visit.  Palpitations: Previous workup in [facility] including Holter monitor all negative. I will send off a thyroid function test and if still symptomatic she will be referred to Cardiology-Dr. Daleen Squibb.    This note has been created with Education officer, environmental. Any  transcriptional errors are unintentional.     Jaclyn Shaggy, MD. [facility] [phone] 03/09/2015, 12:30 PM

## 2015-03-10 LAB — TSH: TSH: 1.988 u[IU]/mL (ref 0.350–4.500)

## 2015-03-15 ENCOUNTER — Ambulatory Visit: Payer: 59

## 2015-03-23 ENCOUNTER — Encounter: Payer: Self-pay | Admitting: Family Medicine

## 2015-03-23 ENCOUNTER — Ambulatory Visit: Payer: 59 | Attending: Family Medicine | Admitting: Family Medicine

## 2015-03-23 VITALS — BP 138/77 | HR 75 | Temp 98.3°F | Resp 16 | Ht 63.0 in | Wt 244.0 lb

## 2015-03-23 DIAGNOSIS — D573 Sickle-cell trait: Secondary | ICD-10-CM | POA: Insufficient documentation

## 2015-03-23 DIAGNOSIS — L309 Dermatitis, unspecified: Secondary | ICD-10-CM

## 2015-03-23 DIAGNOSIS — Z8673 Personal history of transient ischemic attack (TIA), and cerebral infarction without residual deficits: Secondary | ICD-10-CM | POA: Insufficient documentation

## 2015-03-23 DIAGNOSIS — R197 Diarrhea, unspecified: Secondary | ICD-10-CM

## 2015-03-23 DIAGNOSIS — R002 Palpitations: Secondary | ICD-10-CM | POA: Diagnosis not present

## 2015-03-23 DIAGNOSIS — Z79899 Other long term (current) drug therapy: Secondary | ICD-10-CM | POA: Insufficient documentation

## 2015-03-23 DIAGNOSIS — I638 Other cerebral infarction: Secondary | ICD-10-CM | POA: Diagnosis not present

## 2015-03-23 DIAGNOSIS — I6389 Other cerebral infarction: Secondary | ICD-10-CM

## 2015-03-23 MED ORDER — ATORVASTATIN CALCIUM 40 MG PO TABS: 40.0000 mg | ORAL_TABLET | Freq: Every day | ORAL | Status: DC

## 2015-03-23 NOTE — Patient Instructions (Signed)

## 2015-03-23 NOTE — Progress Notes (Signed)
Subjective:    Patient ID: Whitney Rose, female    DOB: [birth year], 49 y.o.   MRN: [id number]  HPI Whitney Rose is a 49 year old female with a history of TIA/?stroke (with no residual deficits and was placed on Lipitor for secondary prevention) who recently relocated from [facility] and is here to follow-up on palpitations which she had complained of at her last office visit .  Palpitations have since stopped but today she is complaining about dark discoloration of her lips and diarrhea of up to 3 times a day which she attributes to the Lipitor she is taking because Prior to taking Lipitor she never had such symptoms. She denies ingestion of new foods or use of new creams.  She would like to try a reduced dose of Lipitor to see if there will be improvement in symptoms.   Her blood pressure was also elevated at last office visit and she had no previous diagnosis of hypertension  but it has normalized today.   Past Medical History  Diagnosis Date  . Sickle cell trait (HCC)   . Eczema   . Stroke [facility])     Past Surgical History  Procedure Laterality Date  . Cholecystectomy  2001    No Known Allergies  Social History   Social History  . Marital Status: Married    Spouse Name: N/A  . Number of Children: N/A  . Years of Education: N/A   Occupational History  . Not on file.   Social History Main Topics  . Smoking status: Never Smoker   . Smokeless tobacco: Never Used  . Alcohol Use: No  . Drug Use: No  . Sexual Activity: Yes   Other Topics Concern  . Not on file   Social History Narrative    Current Outpatient Prescriptions on File Prior to Visit  Medication Sig Dispense Refill  . triamcinolone cream (KENALOG) 0.1 % Apply topically 2 (two) times daily. To affected areas as needed 453.6 g 1  . pseudoephedrine-acetaminophen (TYLENOL SINUS) 30-500 MG TABS tablet Take 1 tablet by mouth every 4 (four) hours as needed.     No current facility-administered medications  on file prior to visit.      Review of Systems Constitutional: Negative for fever, chills, diaphoresis, activity change, appetite change and fatigue. HENT: Negative for ear pain, nosebleeds, congestion, facial swelling, rhinorrhea, neck pain, neck stiffness and ear discharge.  Eyes: Negative for pain, discharge, redness, itching and visual disturbance. Respiratory: Negative for cough, choking, chest tightness, shortness of breath, wheezing and stridor.  Cardiovascular: Negative for chest pain, positive for palpitations and negative for leg swelling. Gastrointestinal: Negative for abdominal distention. Genitourinary: Negative for dysuria, urgency, frequency, hematuria, flank pain, decreased urine volume, difficulty urinating and dyspareunia.  Musculoskeletal: Negative for back pain, joint swelling, arthralgias and gait problem. Neurological: Negative for dizziness, tremors, seizures, syncope, facial asymmetry, speech difficulty, weakness, light-headedness, numbness and headaches.  Skin: See history of present illness  Hematological: Negative for adenopathy. Does not bruise/bleed easily. Psychiatric/Behavioral: Negative for hallucinations, behavioral problems, confusion, dysphoric mood, decreased concentration and agitation.       Objective: Filed Vitals:   03/23/15 0918  BP: 138/77  Pulse: 75  Temp: 98.3 F (36.8 C)  Resp: 16      Physical Exam Constitutional: Patient appears well-developed and well-nourished. No distress. Neck: Normal ROM. Neck supple. No JVD. No tracheal deviation. No thyromegaly. CVS: RRR, S1/S2 +, no murmurs, no gallops, no carotid bruit.  Pulmonary: Effort and  breath sounds normal, no stridor, rhonchi, wheezes, rales.  Abdominal: Soft. BS +,  no distension, tenderness, rebound or guarding.  Musculoskeletal: Normal range of motion. No edema and no tenderness.  Lymphadenopathy: No lymphadenopathy noted, cervical, inguinal or axillary Neuro: Alert. Normal  reflexes, muscle tone coordination. No cranial nerve deficit. Skin: Skin is warm and dry. Eczematous rash noted on palms and on dorsum of both feet which is dry and scaly with associated lichenification.  Psychiatric: Normal mood and affect. Behavior, judgment, thought content normal.      Assessment & Plan:  49 year old female with a history of a eczema, stroke/TIA  with intermittent episodes of palpitations;   Previous history of stroke: No residual deficit. Aggressive risk factor modification. Continuereduced dose of Lipitor and we will monitor the dark discoloration of her lips.  Diarrhea: Patient is of the opinion that this is secondary to her Lipitor and so I am reducing Lipitor from 80 mg to 40 mg. She is denying infectious etiology and is refusing a workup of a possible etiologies She also knows to inform us in the event her symptoms become bothersome and embarrassing so we can place on antimotility agents which she is refusing at this time.  Eczema: Currently on triamcinolone. Discussed skin care and the use of fragrance free body washes and body lotions.  Palpitations: Normal heart function. Resolved.  Advised to come in to get established and to have her annual wellness exam.     This note has been created with Education officer, environmentalDragon speech recognition software and smart phrase technology. Any transcriptional errors are unintentional.

## 2015-03-23 NOTE — Progress Notes (Signed)
Pt's here f/u for palpitation. Pt's reports feelings well.   Pt states that her lips are turning black since starting lipitor 07/16 after having TIA.  Offered Pt Flu vaccine, but pt declined.

## 2015-03-24 ENCOUNTER — Ambulatory Visit: Payer: 59 | Attending: Internal Medicine

## 2015-04-24 ENCOUNTER — Ambulatory Visit: Payer: 59 | Admitting: Family Medicine

## 2015-05-18 ENCOUNTER — Telehealth: Payer: Self-pay | Admitting: Family Medicine

## 2015-05-18 NOTE — Telephone Encounter (Signed)
Pt. Called requesting a med refill for Lipitor. Please f/u with pt.

## 2015-05-19 NOTE — Telephone Encounter (Signed)
Left HIPAA compliant message for patient on her cell.  Her home number is not active.  Patient should have refills of this medication already.  Patient has appointment on 05/26/15.

## 2015-05-26 ENCOUNTER — Ambulatory Visit: Payer: 59 | Admitting: Family Medicine

## 2015-06-01 ENCOUNTER — Other Ambulatory Visit: Payer: Self-pay | Admitting: *Deleted

## 2015-06-01 MED ORDER — ATORVASTATIN CALCIUM 40 MG PO TABS: 40.0000 mg | ORAL_TABLET | Freq: Every day | ORAL | Status: DC

## 2015-06-01 NOTE — Telephone Encounter (Signed)
Patient calling in asking for refill on her Lipitor.  Explained to patient that she was to have come in in November to establish care and discuss with the MD how the new Lipitor dosage was working out.  Patient states she does not have insurance and the new dose is working fine.  I will give her a 1 month supply of Lipitor to [facility] pharmacy and I made her an appointment to establish care on January 9th at 11:30.  Patient verbalized understanding.  She was also told about our financial counselors that could help her obtain the [facility] discount.

## 2015-06-12 ENCOUNTER — Ambulatory Visit: Payer: 59 | Admitting: Family Medicine

## 2015-07-03 ENCOUNTER — Ambulatory Visit: Payer: 59 | Admitting: Family Medicine

## 2018-03-18 ENCOUNTER — Inpatient Hospital Stay (HOSPITAL_COMMUNITY): Payer: Medicaid - Out of State

## 2018-03-18 ENCOUNTER — Inpatient Hospital Stay (HOSPITAL_COMMUNITY)
Admission: EM | Admit: 2018-03-18 | Discharge: 2018-03-19 | DRG: 062 | Disposition: A | Discharge status: Home Health | Payer: Medicaid - Out of State | Attending: Neurology | Admitting: Neurology

## 2018-03-18 ENCOUNTER — Emergency Department (HOSPITAL_COMMUNITY): Payer: Medicaid - Out of State

## 2018-03-18 DIAGNOSIS — R4701 Aphasia: Secondary | ICD-10-CM | POA: Diagnosis present

## 2018-03-18 DIAGNOSIS — I63511 Cerebral infarction due to unspecified occlusion or stenosis of right middle cerebral artery: Secondary | ICD-10-CM | POA: Diagnosis present

## 2018-03-18 DIAGNOSIS — I639 Cerebral infarction, unspecified: Secondary | ICD-10-CM

## 2018-03-18 DIAGNOSIS — I10 Essential (primary) hypertension: Secondary | ICD-10-CM | POA: Diagnosis present

## 2018-03-18 DIAGNOSIS — I69354 Hemiplegia and hemiparesis following cerebral infarction affecting left non-dominant side: Secondary | ICD-10-CM

## 2018-03-18 DIAGNOSIS — D509 Iron deficiency anemia, unspecified: Secondary | ICD-10-CM

## 2018-03-18 DIAGNOSIS — E876 Hypokalemia: Secondary | ICD-10-CM | POA: Diagnosis present

## 2018-03-18 DIAGNOSIS — I5030 Unspecified diastolic (congestive) heart failure: Secondary | ICD-10-CM

## 2018-03-18 DIAGNOSIS — R778 Other specified abnormalities of plasma proteins: Secondary | ICD-10-CM

## 2018-03-18 DIAGNOSIS — D573 Sickle-cell trait: Secondary | ICD-10-CM | POA: Diagnosis present

## 2018-03-18 DIAGNOSIS — Z8673 Personal history of transient ischemic attack (TIA), and cerebral infarction without residual deficits: Secondary | ICD-10-CM

## 2018-03-18 DIAGNOSIS — R569 Unspecified convulsions: Secondary | ICD-10-CM | POA: Insufficient documentation

## 2018-03-18 DIAGNOSIS — I6300 Cerebral infarction due to thrombosis of unspecified precerebral artery: Secondary | ICD-10-CM | POA: Diagnosis not present

## 2018-03-18 DIAGNOSIS — R482 Apraxia: Secondary | ICD-10-CM | POA: Diagnosis present

## 2018-03-18 DIAGNOSIS — I63412 Cerebral infarction due to embolism of left middle cerebral artery: Principal | ICD-10-CM | POA: Diagnosis present

## 2018-03-18 DIAGNOSIS — R7989 Other specified abnormal findings of blood chemistry: Secondary | ICD-10-CM

## 2018-03-18 DIAGNOSIS — I69320 Aphasia following cerebral infarction: Secondary | ICD-10-CM

## 2018-03-18 LAB — PROTIME-INR
INR: 1.07
PROTHROMBIN TIME: 13.8 s (ref 11.4–15.2)

## 2018-03-18 LAB — RAPID URINE DRUG SCREEN, HOSP PERFORMED
AMPHETAMINES: NOT DETECTED
BENZODIAZEPINES: NOT DETECTED
Barbiturates: NOT DETECTED
Cocaine: NOT DETECTED
Opiates: NOT DETECTED
Tetrahydrocannabinol: NOT DETECTED

## 2018-03-18 LAB — DIFFERENTIAL
Abs Immature Granulocytes: 0.03 10*3/uL (ref 0.00–0.07)
Basophils Absolute: 0 10*3/uL (ref 0.0–0.1)
Basophils Relative: 0 %
EOS ABS: 0.1 10*3/uL (ref 0.0–0.5)
EOS PCT: 1 %
Immature Granulocytes: 0 %
LYMPHS ABS: 3 10*3/uL (ref 0.7–4.0)
LYMPHS PCT: 32 %
MONO ABS: 0.9 10*3/uL (ref 0.1–1.0)
MONOS PCT: 9 %
NEUTROS PCT: 58 %
Neutro Abs: 5.3 10*3/uL (ref 1.7–7.7)

## 2018-03-18 LAB — HEMOGLOBIN A1C
HEMOGLOBIN A1C: 5.5 % (ref 4.8–5.6)
Mean Plasma Glucose: 111.15 mg/dL

## 2018-03-18 LAB — I-STAT CHEM 8, ED
BUN: 11 mg/dL (ref 6–20)
CREATININE: 1 mg/dL (ref 0.44–1.00)
Calcium, Ion: 1.22 mmol/L (ref 1.15–1.40)
Chloride: 109 mmol/L (ref 98–111)
Glucose, Bld: 85 mg/dL (ref 70–99)
HEMATOCRIT: 34 % — AB (ref 36.0–46.0)
HEMOGLOBIN: 11.6 g/dL — AB (ref 12.0–15.0)
POTASSIUM: 3.1 mmol/L — AB (ref 3.5–5.1)
SODIUM: 145 mmol/L (ref 135–145)
TCO2: 23 mmol/L (ref 22–32)

## 2018-03-18 LAB — CBC
HCT: 33.5 % — ABNORMAL LOW (ref 36.0–46.0)
HCT: 27.9 % — ABNORMAL LOW (ref 36.0–46.0)
HEMOGLOBIN: 8.1 g/dL — AB (ref 12.0–15.0)
Hemoglobin: 9.8 g/dL — ABNORMAL LOW (ref 12.0–15.0)
MCH: 20.5 pg — AB (ref 26.0–34.0)
MCH: 20.8 pg — ABNORMAL LOW (ref 26.0–34.0)
MCHC: 29.3 g/dL — ABNORMAL LOW (ref 30.0–36.0)
MCHC: 29 g/dL — ABNORMAL LOW (ref 30.0–36.0)
MCV: 69.9 fL — AB (ref 80.0–100.0)
MCV: 71.7 fL — AB (ref 80.0–100.0)
PLATELETS: 308 10*3/uL (ref 150–400)
Platelets: 291 10*3/uL (ref 150–400)
RBC: 4.79 MIL/uL (ref 3.87–5.11)
RBC: 3.89 MIL/uL (ref 3.87–5.11)
RDW: 18.9 % — AB (ref 11.5–15.5)
RDW: 19 % — ABNORMAL HIGH (ref 11.5–15.5)
WBC: 9.3 10*3/uL (ref 4.0–10.5)
WBC: 9.8 10*3/uL (ref 4.0–10.5)
nRBC: 0 % (ref 0.0–0.2)
nRBC: 0 % (ref 0.0–0.2)

## 2018-03-18 LAB — ETHANOL: Alcohol, Ethyl (B): <10 mg/dL (ref <10)

## 2018-03-18 LAB — I-STAT TROPONIN, ED: Troponin i, poc: 0.1 ng/mL (ref 0.00–0.08)

## 2018-03-18 LAB — LIPID PANEL
Cholesterol: 99 mg/dL (ref 0–200)
HDL: 40 mg/dL — ABNORMAL LOW (ref >40)
LDL CALC: 46 mg/dL (ref 0–99)
Total CHOL/HDL Ratio: 2.5 RATIO
Triglycerides: 67 mg/dL (ref <150)
VLDL: 13 mg/dL (ref 0–40)

## 2018-03-18 LAB — COMPREHENSIVE METABOLIC PANEL
ALK PHOS: 86 U/L (ref 38–126)
ALT: 37 U/L (ref 0–44)
ANION GAP: 11 (ref 5–15)
AST: 34 U/L (ref 15–41)
Albumin: 3.4 g/dL — ABNORMAL LOW (ref 3.5–5.0)
BILIRUBIN TOTAL: 0.6 mg/dL (ref 0.3–1.2)
BUN: 11 mg/dL (ref 6–20)
CALCIUM: 9.5 mg/dL (ref 8.9–10.3)
CO2: 22 mmol/L (ref 22–32)
Chloride: 109 mmol/L (ref 98–111)
Creatinine, Ser: 1.03 mg/dL — ABNORMAL HIGH (ref 0.44–1.00)
Glucose, Bld: 91 mg/dL (ref 70–99)
POTASSIUM: 3.1 mmol/L — AB (ref 3.5–5.1)
Sodium: 142 mmol/L (ref 135–145)
Total Protein: 7.5 g/dL (ref 6.5–8.1)

## 2018-03-18 LAB — HIV ANTIBODY (ROUTINE TESTING W REFLEX): HIV SCREEN 4TH GENERATION: NONREACTIVE

## 2018-03-18 LAB — ECHOCARDIOGRAM COMPLETE

## 2018-03-18 LAB — URINALYSIS, ROUTINE W REFLEX MICROSCOPIC
BILIRUBIN URINE: NEGATIVE
Glucose, UA: NEGATIVE mg/dL
HGB URINE DIPSTICK: NEGATIVE
Ketones, ur: NEGATIVE mg/dL
Leukocytes, UA: NEGATIVE
Nitrite: NEGATIVE
PH: 6 (ref 5.0–8.0)
Protein, ur: NEGATIVE mg/dL
SPECIFIC GRAVITY, URINE: 1.021 (ref 1.005–1.030)

## 2018-03-18 LAB — APTT: APTT: 30 s (ref 24–36)

## 2018-03-18 LAB — I-STAT BETA HCG BLOOD, ED (MC, WL, AP ONLY): I-stat hCG, quantitative: <5 m[IU]/mL (ref <5)

## 2018-03-18 LAB — TECHNOLOGIST SMEAR REVIEW

## 2018-03-18 LAB — MRSA PCR SCREENING: MRSA by PCR: NEGATIVE

## 2018-03-18 LAB — CBG MONITORING, ED: Glucose-Capillary: 82 mg/dL (ref 70–99)

## 2018-03-18 LAB — SAVE SMEAR(SSMR), FOR PROVIDER SLIDE REVIEW

## 2018-03-18 MED ORDER — FAMOTIDINE IN NACL 20-0.9 MG/50ML-% IV SOLN
20.0000 mg | Freq: Two times a day (BID) | INTRAVENOUS | Status: DC
  Administered 2018-03-18 (×3): 20 mg via INTRAVENOUS
  Filled 2018-03-18 (×3): qty 50

## 2018-03-18 MED ORDER — ALTEPLASE (STROKE) FULL DOSE INFUSION
0.9000 mg/kg | Freq: Once | INTRAVENOUS | Status: AC
  Administered 2018-03-18: 76.7 mg via INTRAVENOUS
  Filled 2018-03-18: qty 100

## 2018-03-18 MED ORDER — ACETAMINOPHEN 325 MG PO TABS: 650.0000 mg | ORAL_TABLET | ORAL | Status: DC | PRN

## 2018-03-18 MED ORDER — SODIUM CHLORIDE 0.9 % IV SOLN
INTRAVENOUS | Status: DC
  Administered 2018-03-18: 03:00:00 via INTRAVENOUS

## 2018-03-18 MED ORDER — STROKE: EARLY STAGES OF RECOVERY BOOK
Freq: Once | Status: AC
  Administered 2018-03-18: 04:00:00
  Filled 2018-03-18: qty 1

## 2018-03-18 MED ORDER — LORAZEPAM 2 MG/ML IJ SOLN
1.0000 mg | Freq: Once | INTRAMUSCULAR | Status: AC
  Administered 2018-03-18: 1 mg via INTRAVENOUS

## 2018-03-18 MED ORDER — LORAZEPAM 2 MG/ML IJ SOLN
INTRAMUSCULAR | Status: AC
  Administered 2018-03-18: 01:00:00
  Filled 2018-03-18: qty 1

## 2018-03-18 MED ORDER — ACETAMINOPHEN 650 MG RE SUPP: 650.0000 mg | RECTAL | Status: DC | PRN

## 2018-03-18 MED ORDER — IOPAMIDOL (ISOVUE-370) INJECTION 76%
50.0000 mL | Freq: Once | INTRAVENOUS | Status: AC | PRN
  Administered 2018-03-18: 50 mL via INTRAVENOUS

## 2018-03-18 MED ORDER — ACETAMINOPHEN 160 MG/5ML PO SOLN: 650.0000 mg | ORAL | Status: DC | PRN

## 2018-03-18 NOTE — H&P (Signed)
Neurology H&P  CC: Aphasia  History is obtained from:patient  HPI: Whitney Rose is a 52 y.o. female with a history of  Previous stroke with subsequent left arm weakness who was last know well shortly after 10pm. She  had a witnessed change with decreased speech and subsequently was brought to the ER by EMS.  On arrival it was noted that she had significant aphasia.  I discussed the risks/benefits of IV TPA and after multiple discussions, calling 3 separate family members they did agreed to proceed with IV TPA.  At baseline, she is unable to be left alone, needs assistance.  She is able to communicate and interact, however.  She is able to walk, but often drags her left leg.  She has had multiple strokes in the past treated at [facility].  She had a surgery/procedure of some type there about 1.5 years ago, though I am unclear of exactly what type of surgery was done.  A second surgery was recommended but the patient declined.  She has been maintained on Plavix.  She was grossly combative on arrival, and had to be given a total of 2 mg of Ativan to obtain imaging.  LKW: 2220 tpa given?: yes MRS: 3  ROS: A 14 point ROS was performed and is negative except as noted in the HPI.  Past Medical History:  Diagnosis Date  . Eczema   . Sickle cell trait (HCC)   . Stroke [facility])      Family History  Problem Relation Age of Onset  . Heart disease Mother        CHF  . COPD Mother      Social History:  reports that she has never smoked. She has never used smokeless tobacco. She reports that she does not drink alcohol or use drugs.   Exam: Current vital signs: Vitals:   03/18/18 0345 03/18/18 0400  BP: (!) 117/58 (!) 129/59  Pulse: (!) 57 (!) 57  Resp: 16 15  Temp:    SpO2: 100% 100%    Vital signs in last 24 hours: BP: ()/()  Arterial Line BP: ()/()   Physical Exam  Constitutional: Appears well-developed and well-nourished.  Psych: Affect appropriate to situation Eyes: No scleral  injection HENT: No OP obstrucion Head: Normocephalic.  Cardiovascular: Normal rate and regular rhythm.  Respiratory: Effort normal and breath sounds normal to anterior ascultation GI: Soft.  No distension. There is no tenderness.  Skin: WDI  Neuro: Mental Status: Patient is awake, alert, occasionally says "Please help me" but otherwise without verbal output   Cranial Nerves: II: Does not clearly blink to threat from either side. Pupils are equal, round, and reactive to light.   III,IV, VI: EOMI without ptosis or diploplia.  V: Facial sensation is symmetric to temperature VII: Facial movement is symmetric.  VIII: hearing is intact to voice X: Uvula elevates symmetrically XI: Shoulder shrug is symmetric. XII: tongue is midline without atrophy or fasciculations.  Motor: Tone is normal. Bulk is normal. 5/5 strength was present in the right arm and leg, she appears to move the left leg less than the right, but does move it against gravity.  She does not lift left arm against gravity. sensory: She responds to noxious stimulation on the right more than left. Cerebellar: FNF and HKS are intact bilaterally   I have reviewed labs in epic and the results pertinent to this consultation are: CMP-unremarkable  I have reviewed the images obtained: CT head- no acute findings, multiple  previous strokes, CTA- distal M2 bordering on M3 occlusion  Primary Diagnosis:  Cerebral infarction due to embolism of left middle cerebral artery  Secondary Diagnosis: Stroke   Impression: 52 year old female with multiple recurrent strokes of with a etiology that is unclear to me at this time.  We will need to get her records from [place].  She is here within the timeframe for IV TPA and therefore this has been administered.  Due to her impaired functional status at baseline, as well as caliber vessels in the distal location of the clot, is decided not to pursue interventional therapy.  Recommendations: -  HgbA1c, fasting lipid panel - MRI of the brain without contrast - Frequent neuro checks - Echocardiogram - Prophylactic therapy none for 24 hours- Risk factor modification - Telemetry monitoring - PT consult, OT consult, Speech consult -Obtain outside records from [place], I have placed an order to obtain these - Stroke team to follow  This patient is critically ill and at significant risk of neurological worsening, death and care requires constant monitoring of vital signs, hemodynamics,respiratory and cardiac monitoring, neurological assessment, discussion with family, other specialists and medical decision making of high complexity. I spent 60 minutes of neurocritical care time  in the care of  this patient.  Ritta Slot, MD [facility] [phone]  If 7pm- 7am, please page neurology on call as listed in AMION. 03/18/2018  1:02 AM

## 2018-03-18 NOTE — ED Notes (Signed)
Dr. Petra Kuba initially assessed pt. at arrival and transported to CT scan .

## 2018-03-18 NOTE — Evaluation (Signed)
Speech Language Pathology Evaluation Patient Details Name: Whitney Rose MRN: [id number] DOB: 06-08-1965 Today's Date: 03/18/2018 Time: 1000-1030 SLP Time Calculation (min) (ACUTE ONLY): 30 min  Problem List:  Patient Active Problem List   Diagnosis Date Noted  . Stroke (cerebrum) (HCC) 03/18/2018  . Stroke (HCC) 03/09/2015  . Plantar fasciitis 10/23/2011  . Health care maintenance 10/23/2011  . OBESITY, UNSPECIFIED 02/02/2009  . ANEMIA 02/02/2009  . PALPITATIONS 01/19/2009  . Eczema 07/31/2006   Past Medical History:  Past Medical History:  Diagnosis Date  . Eczema   . Sickle cell trait (HCC)   . Stroke [facility])    Past Surgical History:  Past Surgical History:  Procedure Laterality Date  . CHOLECYSTECTOMY  2001   HPI:  Whitney Rose is a 52 y.o. female with a history of  Previous stroke with subsequent left arm weakness who was last know well shortly after 10pm. She  had a witnessed change with decreased speech and subsequently was brought to the ER by EMS.  On arrival it was noted that she had significant aphasia.  Was given IV tPA. At baseline, she is unable to be left alone, needs assistance.  She is able to communicate and interact, however.  She is able to walk, but often drags her left leg.   Assessment / Plan / Recommendation Clinical Impression  Pt demonstrates new difficulty with expressive communication, suspected to be a primary motor speech impairment vs a phonemic aphasia. She is fluent, her pitch is flat and her volume is over-loud. Speech is characterized by frequent mis-articulations and phoneme substitutions, but not distortions. Pt is aware of errors and attempts to self correct. Errors increase with complexity of target. Despite this, pt is somewhat intelligible with basic communication as errors are often close to the phonemic/articulatory target and grammar is intact. Suspect regardless of language vs motor diagnosis, pt will rapidly improve with multimodal  cueing and pacing in functional language tasks and structured reading/repetition, branching from simple to more complex words/phrases. Pt has cognitive impairment and mild left inattention from prior CVA that required assist with complex tasks from family. Pt would be an excellent candidate for CIR given good prognosis for rapid recovery.     SLP Assessment  SLP Visit Diagnosis: Apraxia (R48.2);Aphasia (R47.01)    Follow Up Recommendations       Frequency and Duration min 3x week  2 weeks      SLP Evaluation Cognition  Overall Cognitive Status: History of cognitive impairments - at baseline Arousal/Alertness: Awake/alert Orientation Level: Oriented to person;Oriented to place;Oriented to time;Oriented to situation Attention: Sustained;Selective Sustained Attention: Appears intact Selective Attention: Appears intact Memory: Impaired Memory Impairment: Decreased recall of new information;Storage deficit(baseline, ) Awareness: Appears intact Problem Solving: Impaired Problem Solving Impairment: Verbal complex;Functional complex(baseline) Executive Function: Reasoning;Sequencing(baseline) Safety/Judgment: Appears intact       Comprehension  Auditory Comprehension Overall Auditory Comprehension: Impaired Yes/No Questions: Impaired Basic Biographical Questions: 76-100% accurate Basic Immediate Environment Questions: 75-100% accurate Complex Questions: 50-74% accurate Commands: Impaired One Step Basic Commands: 75-100% accurate Two Step Basic Commands: 75-100% accurate Multistep Basic Commands: 0-24% accurate Complex Commands: 0-24% accurate Conversation: Simple Interfering Components: Visual impairments;Processing speed;Working memory EffectiveTechniques: Repetition;Visual/Gestural cues;Extra processing time Visual Recognition/Discrimination Discrimination: Within Function Limits Reading Comprehension Reading Status: Impaired Word level: Within functional limits Sentence  Level: Impaired    Expression Verbal Expression Overall Verbal Expression: Impaired Initiation: No impairment Automatic Speech: Name;Counting;Social Response Level of Generative/Spontaneous Verbalization: Phrase;Sentence;Conversation Repetition: Impaired Level of Impairment: Word level  Naming: No impairment Non-Verbal Means of Communication: Gestures   Oral / Motor  Oral Motor/Sensory Function Overall Oral Motor/Sensory Function: Within functional limits Motor Speech Overall Motor Speech: Impaired Respiration: Within functional limits Phonation: Other (comment)(flat pitch, loud volume) Resonance: Within functional limits Articulation: Impaired Level of Impairment: Word Intelligibility: Intelligible Motor Planning: Impaired Level of Impairment: Word Motor Speech Errors: Groping for words;Inconsistent;Aware Effective Techniques: Slow rate;Pacing   GO                   Harlon Ditty, MA CCC-SLP  Acute Rehabilitation Services Pager [phone] Office [phone]  Claudine Mouton 03/18/2018, 10:58 AM

## 2018-03-18 NOTE — Progress Notes (Signed)
OT Cancellation Note  Patient Details Name: MARELLY WEHRMAN MRN: [id number] DOB: [birth year]   Cancelled Treatment:    Reason Eval/Treat Not Completed: Active bedrest order. Will continue to follow.   Chancy Milroy, OT Acute Rehabilitation Services Pager [phone] Office [phone]   Chancy Milroy 03/18/2018, 10:08 AM

## 2018-03-18 NOTE — ED Notes (Signed)
Alteplase currently infusing , Dr. Petra Kuba explained plan of care to pt.

## 2018-03-18 NOTE — Progress Notes (Signed)
LE venous duplex prelim: negative for DVT.  TCD completed.  Cyler Kappes Eunice, RDMS, RVT  

## 2018-03-18 NOTE — Procedures (Signed)
ELECTROENCEPHALOGRAM REPORT   Patient: Whitney Rose       Room #: 1O10R EEG No. ID: [id number] Age: 52 y.o.        Sex: female Referring Physician: Pearlean Brownie Report Date:  03/18/2018        Interpreting Physician: Thana Farr  History: Whitney Rose is an 52 y.o. female with history of stroke presenting with aphasia  Medications:  Pepcid  Conditions of Recording:  This is a 21 channel routine scalp EEG performed with bipolar and monopolar montages arranged in accordance to the international 10/20 system of electrode placement. One channel was dedicated to EKG recording.  The patient is in the awake and drowsy states.  Description:  Muscle and movement artifact are prominent during the recording often obscuring the background rhythm.  When able to be visualized the waking background activity consists of a low voltage, symmetrical, fairly well organized, 8 Hz alpha activity at its maximum, seen from the parieto-occipital and posterior temporal regions.  Although 8 Hz is the maximum noted this is actually poorly sustained and the posterior background rhythm is more dominantly in the theta range.  Low voltage fast activity, poorly organized, is seen anteriorly and is at times superimposed on more posterior regions.  A mixture of theta and alpha rhythms are seen from the central and temporal regions. The patient drowses with slowing to irregular, low voltage theta and beta activity.   Stage II sleep is not obtained. No epileptiform activity is noted.   Hyperventilation and intermittent photic stimulation were not performed.   IMPRESSION: Normal electroencephalogram, awake and drowsy. There are no focal lateralizing or epileptiform features.   Thana Farr, MD Neurology [phone] 03/18/2018, 1:31 PM

## 2018-03-18 NOTE — Progress Notes (Signed)
EEG completed; results pending.    

## 2018-03-18 NOTE — Evaluation (Addendum)
Physical Therapy Evaluation Patient Details Name: Whitney Rose MRN: [id number] DOB: 03/08/1966 Today's Date: 03/18/2018   History of Present Illness  Pt had stroke in 12/2014 in [place]. Since then as per daughter, she had 6 strokes all in [place]. She had admitted to [facility] medical center and [facility] in the past. P admitted with Patient arrived with EMS from home reports sudden onset altered mental status with slurred speech and left side gaze. CT head showed chronic large right MCA, small ACA and PCA infarcts as well as left small parietal infarcts. CTA head and neck showed left M2 occlusion with distal reconstitution. She received tPA and admitted to ICU.Marland Kitchen   Clinical Impression  Pt was able to walk the hallway with min guard assist today, her cognition, speech and safety are all more impaired compared to PTA.  Daughter reports someone can be with her 24/7, so home therapy would be appropriate follow up as her gait is functional.  PT to follow acutely for deficits listed below.    Follow Up Recommendations Home health PT;Supervision/Assistance - 24 hour    Equipment Recommendations  None recommended by PT    Recommendations for Other Services   NA    Precautions / Restrictions Precautions Precautions: Fall Precaution Comments: left sided weakness and right side inattention.       Mobility  Bed Mobility Overal bed mobility: Needs Assistance Bed Mobility: Supine to Sit     Supine to sit: Supervision        Transfers Overall transfer level: Needs assistance Equipment used: 2 person hand held assist Transfers: Sit to/from Stand Sit to Stand: Min assist         General transfer comment: steady support given  Ambulation/Gait Ambulation/Gait assistance: Min guard Gait Distance (Feet): 65 Feet Assistive device: IV Pole Gait Pattern/deviations: Step-to pattern;Steppage;Decreased dorsiflexion - left     General Gait Details: Pt started with one hand on  IV pole and free hand supported by therapist with one therapist on each side, but was able to progress to min guard assist from one therapist without the support of the IV pole.  She does have difficulty attending to her right side during gait.       Modified Rankin (Stroke Patients Only) Modified Rankin (Stroke Patients Only) Pre-Morbid Rankin Score: Moderate disability Modified Rankin: Moderately severe disability     Balance Overall balance assessment: Needs assistance   Sitting balance-Leahy Scale: Good     Standing balance support: Single extremity supported;No upper extremity supported Standing balance-Leahy Scale: Fair                               Pertinent Vitals/Pain Pain Assessment: No/denies pain    Home Living Family/patient expects to be discharged to:: Private residence Living Arrangements: Children Available Help at Discharge: Family;Available 24 hours/day Type of Home: House Home Access: Stairs to enter Entrance Stairs-Rails: None Entrance Stairs-Number of Steps: 1 Home Layout: Two level;Bed/bath upstairs Home Equipment: None      Prior Function Level of Independence: Needs assistance   Gait / Transfers Assistance Needed: Ambulated without assistnace; family assisted into shower  ADL's / Homemaking Assistance Needed: Pt bathed herself but required assistnace with dressing due to "putting her clothes on inside out/backwards"        Hand Dominance   Dominant Hand: Right(ambidextrious)    Extremity/Trunk Assessment      Lower Extremity Assessment Lower Extremity Assessment: LLE  deficits/detail LLE Deficits / Details: left leg with weakness during gait, L DF weakness resulting in a bit of foot drag and steppage gait pattern without buckling or hyperextension at the knee.  Per her daughter, her walking is close to baseline.      Cervical / Trunk Assessment Cervical / Trunk Assessment: Other exceptions Cervical / Trunk Exceptions:  lateral trunk difference from previous CVA  Communication   Communication: Expressive difficulties  Cognition Arousal/Alertness: Awake/alert Behavior During Therapy: Impulsive Overall Cognitive Status: Impaired/Different from baseline Area of Impairment: Attention;Safety/judgement;Awareness;Problem solving                   Current Attention Level: Selective     Safety/Judgement: Decreased awareness of safety;Decreased awareness of deficits Awareness: Emergent Problem Solving: Slow processing;Requires verbal cues               Assessment/Plan    PT Assessment Patient needs continued PT services  PT Problem List Decreased strength;Decreased activity tolerance;Decreased balance;Decreased coordination;Decreased mobility;Decreased cognition;Decreased knowledge of use of DME;Decreased safety awareness;Impaired sensation       PT Treatment Interventions DME instruction;Gait training;Stair training;Functional mobility training;Therapeutic activities;Therapeutic exercise;Balance training;Neuromuscular re-education;Cognitive remediation;Patient/family education    PT Goals (Current goals can be found in the Care Plan section)  Acute Rehab PT Goals Patient Stated Goal: per family to return home PT Goal Formulation: With family Time For Goal Achievement: 04/01/18 Potential to Achieve Goals: Good    Frequency Min 4X/week        Co-evaluation PT/OT/SLP Co-Evaluation/Treatment: Yes Reason for Co-Treatment: Complexity of the patient's impairments (multi-system involvement);Necessary to address cognition/behavior during functional activity;For patient/therapist safety;To address functional/ADL transfers PT goals addressed during session: Mobility/safety with mobility;Balance;Strengthening/ROM OT goals addressed during session: ADL's and self-care       AM-PAC PT "6 Clicks" Daily Activity  Outcome Measure Difficulty turning over in bed (including adjusting bedclothes,  sheets and blankets)?: A Little Difficulty moving from lying on back to sitting on the side of the bed? : A Little Difficulty sitting down on and standing up from a chair with arms (e.g., wheelchair, bedside commode, etc,.)?: Unable Help needed moving to and from a bed to chair (including a wheelchair)?: A Little Help needed walking in hospital room?: A Little Help needed climbing 3-5 steps with a railing? : A Little 6 Click Score: 16    End of Session Equipment Utilized During Treatment: Gait belt Activity Tolerance: Patient tolerated treatment well Patient left: in chair;with call bell/phone within reach;with family/visitor present;with chair alarm set   PT Visit Diagnosis: Muscle weakness (generalized) (M62.81);Difficulty in walking, not elsewhere classified (R26.2);Other symptoms and signs involving the nervous system (W09.811)    Time: 9147-8295 PT Time Calculation (min) (ACUTE ONLY): 30 min   Charges:          Lurena Joiner B. Glorian Mcdonell, PT, DPT  Acute Rehabilitation [phone] pager #[phone] office   PT Evaluation $PT Eval Moderate Complexity: 1 Mod

## 2018-03-18 NOTE — ED Triage Notes (Signed)
Patient arrived with EMS from home reports sudden onset altered mental status with slurred speech and left side gaze , LSN is 2320 , confused and disoriented at arrival , initially evaluated by EDP/Dr. Petra Kuba at arrival and transported to CT scan .

## 2018-03-18 NOTE — Progress Notes (Signed)
STROKE TEAM PROGRESS NOTE   SUBJECTIVE (INTERVAL HISTORY) Her daughter and speech therapist are at the bedside.  Overall she feels her condition is gradually improving.   Daughter recounted HPI with me. Pt had stroke in 12/2014 in [place]. Since then as per daughter, she had 6 strokes all in [place]. She had admitted to [facility] and [facility] in the past but did not know what the reason causing strokes. She had no HTN prior to stroke, but was told to have HTN after stroke. However, her BP at home was good. Due to stroke, she had residue of left UE weakness and memory loss and needing help for some daily activity at home. She can not write now, however, daughter said she is ambidextrous, but use more at left hand.   For the last year, she had 3-4 episodes of lose control of limbs, or eye movements, shivering with bowel movement and then weakness in whole body, not responding but did not lose conscientiousness, usually resolves after taking juice or sweets. Not tongue biting, shaking or jerking, LOC, or b/b incontinence, but every time she needs to go to bathroom right after.   Yesterday, she came out of shower, became confused, agitated and frustrated with her underwear, kept put it in and out of drawer, kept touch her feet, and got up and look around in a repetitive pattern. Not respond to command and then not talking. EMS called, on EMS arrival, she went out door and then did not know where to go and then walked back inside house, very confused.   CT head showed chronic large right MCA, small ACA and PCA infarcts as well as left small parietal infarcts. CTA head and neck showed left M2 occlusion with distal reconstitution. She received tPA and admitted to ICU.  This morning, she was able to speak sentences but replace some words with sounds, making significant paraphasic errors.  However, comprehensive intact. Chronic left UE weakness, but no other new weakness.     OBJECTIVE Temp:  [97.6 F (36.4 C)-98.4 F (36.9 C)] 97.6 F (36.4 C) (10/16 0800) Pulse Rate:  [49-84] 51 (10/16 0900) Cardiac Rhythm: Sinus bradycardia (10/16 0800) Resp:  [12-28] 12 (10/16 0900) BP: (115-159)/(40-134) 136/59 (10/16 0900) SpO2:  [100 %] 100 % (10/16 0900)  Recent Labs  Lab 03/18/18 0142  GLUCAP 82   Recent Labs  Lab 03/18/18 0117 03/18/18 0127  NA 142 145  K 3.1* 3.1*  CL 109 109  CO2 22  --   GLUCOSE 91 85  BUN 11 11  CREATININE 1.03* 1.00  CALCIUM 9.5  --    Recent Labs  Lab 03/18/18 0117  AST 34  ALT 37  ALKPHOS 86  BILITOT 0.6  PROT 7.5  ALBUMIN 3.4*   Recent Labs  Lab 03/18/18 0117 03/18/18 0127 03/18/18 0700  WBC 9.3  --  9.8  NEUTROABS 5.3  --   --   HGB 9.8* 11.6* 8.1*  HCT 33.5* 34.0* 27.9*  MCV 69.9*  --  71.7*  PLT 308  --  291   No results for input(s): CKTOTAL, CKMB, CKMBINDEX, TROPONINI in the last 168 hours. Recent Labs    03/18/18 0117  LABPROT 13.8  INR 1.07   Recent Labs    03/18/18 0147  COLORURINE STRAW*  LABSPEC 1.021  PHURINE 6.0  GLUCOSEU NEGATIVE  HGBUR NEGATIVE  BILIRUBINUR NEGATIVE  KETONESUR NEGATIVE  PROTEINUR NEGATIVE  NITRITE NEGATIVE  LEUKOCYTESUR NEGATIVE  Component Value Date/Time   CHOL 99 03/18/2018 0117   TRIG 67 03/18/2018 0117   HDL 40 (L) 03/18/2018 0117   CHOLHDL 2.5 03/18/2018 0117   VLDL 13 03/18/2018 0117   LDLCALC 46 03/18/2018 0117   Lab Results  Component Value Date   HGBA1C 5.5 03/18/2018      Component Value Date/Time   LABOPIA NONE DETECTED 03/18/2018 0117   COCAINSCRNUR NONE DETECTED 03/18/2018 0117   LABBENZ NONE DETECTED 03/18/2018 0117   AMPHETMU NONE DETECTED 03/18/2018 0117   THCU NONE DETECTED 03/18/2018 0117   LABBARB NONE DETECTED 03/18/2018 0117    Recent Labs  Lab 03/18/18 0117  ETH <10    I have personally reviewed the radiological images below and agree with the radiology interpretations.  Ct Angio Head W Or Wo  Contrast  Result Date: 03/18/2018 CLINICAL DATA:  52 y/o F; altered mental status, in character in speech, left gaze. EXAM: CT ANGIOGRAPHY HEAD AND NECK TECHNIQUE: Multidetector CT imaging of the head and neck was performed using the standard protocol during bolus administration of intravenous contrast. Multiplanar CT image reconstructions and MIPs were obtained to evaluate the vascular anatomy. Carotid stenosis measurements (when applicable) are obtained utilizing NASCET criteria, using the distal internal carotid diameter as the denominator. CONTRAST:  50 cc Isovue 370 COMPARISON:  03/18/2018 CT head. FINDINGS: CTA NECK FINDINGS Aortic arch: Bovine variant branching. Imaged portion shows no evidence of aneurysm or dissection. No significant stenosis of the major arch vessel origins. Right carotid system: No evidence of dissection, stenosis (50% or greater) or occlusion. Asymmetrically small in caliber likely due to decreased blood flow from multiple chronic infarctions in the right cerebral hemisphere. Left carotid system: No evidence of dissection, stenosis (50% or greater) or occlusion. Vertebral arteries: Left dominant. No evidence of dissection, stenosis (50% or greater) or occlusion. Skeleton: Mild cervical spondylosis and reversal of cervical curvature. No high-grade bony canal stenosis. Other neck: Prominent right lower posterior cervical and left axillary lymph nodes of uncertain significance. Upper chest: Negative. Review of the MIP images confirms the above findings CTA HEAD FINDINGS Anterior circulation: Left M2 inferior division approximately 1 cm length occlusion at the level of mid insula (series 12, image 28) with reconstitution of the vessel downstream. Small caliber right internal carotid artery and MCA distribution likely due to decreased blood flow from extensive infarction. Patent right MCA and bilateral ACA without large vessel occlusion. No aneurysm or vascular malformation in the anterior  circulation. Posterior circulation: No significant stenosis, proximal occlusion, aneurysm, or vascular malformation. Venous sinuses: As permitted by contrast timing, patent. Anatomic variants: None significant. Review of the MIP images confirms the above findings IMPRESSION: 1. Left M2 inferior division approximately 1 cm occlusion at level of mid insula with reconstitution of downstream vessel. 2. No additional intracranial large vessel occlusion, aneurysm, high-grade stenosis, or vascular malformation. 3. Patent carotid and vertebral arteries. No dissection, aneurysm, or hemodynamically significant stenosis utilizing NASCET criteria. These results were called by telephone at the time of interpretation on 03/18/2018 at 1:36 am to Dr. Ritta Slot , who verbally acknowledged these results. Electronically Signed   By: Mitzi Hansen M.D.   On: 03/18/2018 01:43   Ct Angio Neck W Or Wo Contrast  Result Date: 03/18/2018 CLINICAL DATA:  52 y/o F; altered mental status, in character in speech, left gaze. EXAM: CT ANGIOGRAPHY HEAD AND NECK TECHNIQUE: Multidetector CT imaging of the head and neck was performed using the standard protocol during bolus administration of intravenous  contrast. Multiplanar CT image reconstructions and MIPs were obtained to evaluate the vascular anatomy. Carotid stenosis measurements (when applicable) are obtained utilizing NASCET criteria, using the distal internal carotid diameter as the denominator. CONTRAST:  50 cc Isovue 370 COMPARISON:  03/18/2018 CT head. FINDINGS: CTA NECK FINDINGS Aortic arch: Bovine variant branching. Imaged portion shows no evidence of aneurysm or dissection. No significant stenosis of the major arch vessel origins. Right carotid system: No evidence of dissection, stenosis (50% or greater) or occlusion. Asymmetrically small in caliber likely due to decreased blood flow from multiple chronic infarctions in the right cerebral hemisphere. Left  carotid system: No evidence of dissection, stenosis (50% or greater) or occlusion. Vertebral arteries: Left dominant. No evidence of dissection, stenosis (50% or greater) or occlusion. Skeleton: Mild cervical spondylosis and reversal of cervical curvature. No high-grade bony canal stenosis. Other neck: Prominent right lower posterior cervical and left axillary lymph nodes of uncertain significance. Upper chest: Negative. Review of the MIP images confirms the above findings CTA HEAD FINDINGS Anterior circulation: Left M2 inferior division approximately 1 cm length occlusion at the level of mid insula (series 12, image 28) with reconstitution of the vessel downstream. Small caliber right internal carotid artery and MCA distribution likely due to decreased blood flow from extensive infarction. Patent right MCA and bilateral ACA without large vessel occlusion. No aneurysm or vascular malformation in the anterior circulation. Posterior circulation: No significant stenosis, proximal occlusion, aneurysm, or vascular malformation. Venous sinuses: As permitted by contrast timing, patent. Anatomic variants: None significant. Review of the MIP images confirms the above findings IMPRESSION: 1. Left M2 inferior division approximately 1 cm occlusion at level of mid insula with reconstitution of downstream vessel. 2. No additional intracranial large vessel occlusion, aneurysm, high-grade stenosis, or vascular malformation. 3. Patent carotid and vertebral arteries. No dissection, aneurysm, or hemodynamically significant stenosis utilizing NASCET criteria. These results were called by telephone at the time of interpretation on 03/18/2018 at 1:36 am to Dr. Ritta Slot , who verbally acknowledged these results. Electronically Signed   By: Mitzi Hansen M.D.   On: 03/18/2018 01:43   Ct Head Code Stroke Wo Contrast  Result Date: 03/18/2018 CLINICAL DATA:  Code stroke. 52 y/o F; a incoherent speech, combative,  left-sided gaze. EXAM: CT HEAD WITHOUT CONTRAST TECHNIQUE: Contiguous axial images were obtained from the base of the skull through the vertex without intravenous contrast. COMPARISON:  None. FINDINGS: Brain: Extensive encephalomalacia throughout the right MCA distribution with ex vacuo dilatation of the right lateral ventricle. There additional small foci of encephalomalacia within the bilateral posterior temporal lobes and the left superior parietal lobe. Findings are compatible with multiple chronic infarctions. There is wallerian degeneration of the right cerebral peduncle and hemi brainstem. No acute stroke, hemorrhage, or focal mass effect is identified. No herniation, extra-axial collection, or effacement of basilar cisterns. Incidental partially empty sella turcica. Vascular: No hyperdense vessel or unexpected calcification. Skull: Normal. Negative for fracture or focal lesion. Sinuses/Orbits: No acute finding. Other: None. ASPECTS [facility] Stroke Program Early CT Score) - Ganglionic level infarction (caudate, lentiform nuclei, internal capsule, insula, M1-M3 cortex): 7 - Supraganglionic infarction (M4-M6 cortex): 3 Total score (0-10 with 10 being normal): 10 IMPRESSION: 1. No acute intracranial abnormality identified. 2. ASPECTS is 10 3. Large chronic right MCA distribution infarct and additional small chronic infarcts in the bilateral posterior temporal lobes in the left superior parietal lobe. These results were called by telephone at the time of interpretation on 03/18/2018 at 1:11 am to Dr.  Amada Jupiter, who verbally acknowledged these results. Electronically Signed   By: Mitzi Hansen M.D.   On: 03/18/2018 01:21   MRI and MRA pending  TTE pending  LE venous doppler pending  EEG pending   PHYSICAL EXAM  Temp:  [97.6 F (36.4 C)-98.4 F (36.9 C)] 97.6 F (36.4 C) (10/16 0800) Pulse Rate:  [49-84] 51 (10/16 0900) Resp:  [12-28] 12 (10/16 0900) BP: (115-159)/(40-134) 136/59  (10/16 0900) SpO2:  [100 %] 100 % (10/16 0900)  General - Well nourished, well developed, in no apparent distress.  Ophthalmologic - fundi not visualized due to noncooperation.  Cardiovascular - Regular rate and rhythm.  Neuro - awake alert, eyes open, following all simple commands. Fluent speech but with significant paraphasic errors, and phonic replacement. Able to name and repeat but again with paraphasic errors. No significant neglect, attending to both sides, however, seems slight left gaze preference. PERRL, EOMI, blinking to visual threat on the right, inconsistent blinking to the left. Able to tracking without difficulty. Mild left nasolabial fold flattening. Tongue midline. Left UE 4/5 proximal and 3/5 distal. LLE and RLE and RUE 5/5. Sensation intact, no ataxia, gait not tested.    ASSESSMENT/PLAN Whitney Rose is a 52 y.o. female with history of multiple strokes and possible sickle cell trait/disease admitted for speech difficulty and confusion. TPA given.    Stroke vs. 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 Chronic, and small caliber of b/l ICAs  MRI  pending  MRA  pending  2D Echo  Pending  EEG pending  LE venous doppler pending  TCD pending  LDL 46  HgbA1c 5.5  SCDs for VTE prophylaxis  clopidogrel 75 mg daily or ASA 81 prior to admission, now on No antithrombotic within 24h of TPA  Patient counseled to be compliant with her antithrombotic medications  Ongoing aggressive stroke risk factor management  Therapy recommendations:  pending  Disposition:  Pending  ?  Seizure   Daughter reported 3-4 episodes of confusion, lack of response, lost control of limbs and eye movement, shivering followed by weakness, resolved after taking juice or sweet  CT Chronic right large MCA and small ACA and PCA as well as left small parietal infarcts  EEG pending  May consider addition of AEDs given high likelihood of seizure like episodes  ? Sickle cell disease vs. Trait  Medical records have been requested from [facility] - [facility] fax number left in the request attention to Dr. Delia Heady  Possible hx of sickle cell trait or disease as per daughter  Pt another daughter has Wright trait  Will check Hb electrophoresis  TCD pending to check velocity  Anemia with Hb 9.8->8.1  CBC daily for monitoring  Microcytic anemia   Hb 9.8->8.1  Low MCV and MCH  Iron deficiency vs. Sickle cell   Iron panel pending  Hb electrophoresis pending  Hypertension . Stable  BP goal < 180/105 after tPA  Other Stroke Risk Factors  Hx stroke/TIA - records requested  Other Active Problems  Vascular cognitive impairment - needs help at home  LUE residue weakness  Anemia -  Hb 9.8->8.1 - close monitoring  Hospital day # 0  This patient is critically ill due to stroke like symptoms s/p tPA, old infarct with possible seizure, anemia, ? Sprague disease and at significant risk of neurological worsening, death form recurrent stroke, hemorrhage, status epilepticus, anemia. This patient's care requires constant monitoring of vital signs, hemodynamics, respiratory and cardiac monitoring, review of multiple databases, neurological assessment, discussion with family, other specialists and medical decision making of high complexity. I spent 40 minutes of neurocritical care time in the care of this patient.   Marvel Plan, MD PhD Stroke Neurology 03/18/2018 10:39 AM    To contact Stroke Continuity provider, please refer to [email or web address]. After hours, contact General Neurology

## 2018-03-18 NOTE — ED Provider Notes (Signed)
[facility] EMERGENCY DEPARTMENT Provider Note   CSN: [id number] Arrival date & time: 03/18/18  4098     History   Chief Complaint No chief complaint on file.   HPI Whitney Rose is a 52 y.o. female.  The history is provided by the patient. The history is limited by the condition of the patient (Altered mental status).  She has history stroke and sickle cell trait and comes in as a code stroke.  Family was with her and noted an abrupt change at 2320.  She became a phasic and confused.  EMS also reports that she was somewhat combative.  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  Past Medical History:  Diagnosis Date  . Eczema   . Sickle cell trait (HCC)   . Stroke [facility])     Patient Active Problem List   Diagnosis Date Noted  . Stroke (HCC) 03/09/2015  . Plantar fasciitis 10/23/2011  . Health care maintenance 10/23/2011  . OBESITY, UNSPECIFIED 02/02/2009  . ANEMIA 02/02/2009  . PALPITATIONS 01/19/2009  . Eczema 07/31/2006    Past Surgical History:  Procedure Laterality Date  . CHOLECYSTECTOMY  2001     OB History   None      Home Medications    Prior to Admission medications   Medication Sig Start Date End Date Taking? Authorizing Provider  aspirin 81 MG tablet Take 81 mg by mouth daily.    [provider]  atorvastatin (LIPITOR) 40 MG tablet Take 1 tablet (40 mg total) by mouth daily. 06/01/15   Hoy Register, MD  pseudoephedrine-acetaminophen (TYLENOL SINUS) 30-500 MG TABS tablet Take 1 tablet by mouth every 4 (four) hours as needed.    [provider]  triamcinolone cream (KENALOG) 0.1 % Apply topically 2 (two) times daily. To affected areas as needed 03/01/15   Charlynne Pander, MD    Family History Family History  Problem Relation Age of Onset  . Heart disease Mother        CHF  . COPD Mother     Social History Social History   Tobacco Use  . Smoking  status: Never Smoker  . Smokeless tobacco: Never Used  Substance Use Topics  . Alcohol use: No  . Drug use: No     Allergies   Patient has no known allergies.   Review of Systems Review of Systems  Unable to perform ROS: Mental status change     Physical Exam Updated Vital Signs BP (!) 129/43   Pulse (!) 53   Temp 98.2 F (36.8 C) (Oral)   Resp 18   SpO2 100%   Physical Exam  Nursing note and vitals reviewed.  52 year old female, resting comfortably and in no acute distress. Vital signs are significant for slow heart rate and borderline low blood pressure. Oxygen saturation is 100%, which is normal. Head is normocephalic and atraumatic. PERRLA.  Eyes will not cross the midline to the right. Oropharynx is clear. Neck is nontender and supple without adenopathy or JVD.  There are no carotid bruits. Back is nontender and there is no CVA tenderness. Lungs are clear without rales, wheezes, or rhonchi. Chest is nontender. Heart has regular rate and rhythm without murmur. Abdomen is soft, flat, nontender without masses or hepatosplenomegaly and peristalsis is normoactive. Extremities have no cyanosis or edema, full range of motion is present. Skin is warm and dry without rash. Neurologic: Awake, but neglecting right side,  completely nonverbal and follows commands poorly, cranial nerves are significant for his not looking past the midline to the right, there his left hemiparesis which was known to be present previously.  ED Treatments / Results  Labs (all labs ordered are listed, but only abnormal results are displayed) Labs Reviewed  CBC - Abnormal; Notable for the following components:      Result Value   Hemoglobin 9.8 (*)    HCT 33.5 (*)    MCV 69.9 (*)    MCH 20.5 (*)    MCHC 29.3 (*)    RDW 18.9 (*)    All other components within normal limits  COMPREHENSIVE METABOLIC PANEL - Abnormal; Notable for the following components:   Potassium 3.1 (*)    Creatinine, Ser  1.03 (*)    Albumin 3.4 (*)    All other components within normal limits  URINALYSIS, ROUTINE W REFLEX MICROSCOPIC - Abnormal; Notable for the following components:   Color, Urine STRAW (*)    All other components within normal limits  LIPID PANEL - Abnormal; Notable for the following components:   HDL 40 (*)    All other components within normal limits  I-STAT CHEM 8, ED - Abnormal; Notable for the following components:   Potassium 3.1 (*)    Hemoglobin 11.6 (*)    HCT 34.0 (*)    All other components within normal limits  I-STAT TROPONIN, ED - Abnormal; Notable for the following components:   Troponin i, poc 0.10 (*)    All other components within normal limits  MRSA PCR SCREENING  ETHANOL  PROTIME-INR  APTT  DIFFERENTIAL  RAPID URINE DRUG SCREEN, HOSP PERFORMED  HEMOGLOBIN A1C  HIV ANTIBODY (ROUTINE TESTING W REFLEX)  I-STAT BETA HCG BLOOD, ED ([facility], [facility], [facility] ONLY)  CBG MONITORING, ED    EKG EKG Interpretation  Date/Time:  Wednesday March 18 2018 01:45:38 EDT Ventricular Rate:  72 PR Interval:    QRS Duration: 88 QT Interval:  425 QTC Calculation: 466 R Axis:   41 Text Interpretation:  Sinus rhythm Inferior infarct, age indeterminate When compared with ECG of 03/01/2015, No significant change was found Confirmed by Dione Booze ([id number]) on 03/18/2018 2:02:28 AM   Radiology Ct Angio Head W Or Wo Contrast  Result Date: 03/18/2018 CLINICAL DATA:  52 y/o F; altered mental status, in character in speech, left gaze. EXAM: CT ANGIOGRAPHY HEAD AND NECK TECHNIQUE: Multidetector CT imaging of the head and neck was performed using the standard protocol during bolus administration of intravenous contrast. Multiplanar CT image reconstructions and MIPs were obtained to evaluate the vascular anatomy. Carotid stenosis measurements (when applicable) are obtained utilizing NASCET criteria, using the distal internal carotid diameter as the denominator. CONTRAST:  50 cc Isovue 370  COMPARISON:  03/18/2018 CT head. FINDINGS: CTA NECK FINDINGS Aortic arch: Bovine variant branching. Imaged portion shows no evidence of aneurysm or dissection. No significant stenosis of the major arch vessel origins. Right carotid system: No evidence of dissection, stenosis (50% or greater) or occlusion. Asymmetrically small in caliber likely due to decreased blood flow from multiple chronic infarctions in the right cerebral hemisphere. Left carotid system: No evidence of dissection, stenosis (50% or greater) or occlusion. Vertebral arteries: Left dominant. No evidence of dissection, stenosis (50% or greater) or occlusion. Skeleton: Mild cervical spondylosis and reversal of cervical curvature. No high-grade bony canal stenosis. Other neck: Prominent right lower posterior cervical and left axillary lymph nodes of uncertain significance. Upper chest: Negative. Review of the MIP  images confirms the above findings CTA HEAD FINDINGS Anterior circulation: Left M2 inferior division approximately 1 cm length occlusion at the level of mid insula (series 12, image 28) with reconstitution of the vessel downstream. Small caliber right internal carotid artery and MCA distribution likely due to decreased blood flow from extensive infarction. Patent right MCA and bilateral ACA without large vessel occlusion. No aneurysm or vascular malformation in the anterior circulation. Posterior circulation: No significant stenosis, proximal occlusion, aneurysm, or vascular malformation. Venous sinuses: As permitted by contrast timing, patent. Anatomic variants: None significant. Review of the MIP images confirms the above findings IMPRESSION: 1. Left M2 inferior division approximately 1 cm occlusion at level of mid insula with reconstitution of downstream vessel. 2. No additional intracranial large vessel occlusion, aneurysm, high-grade stenosis, or vascular malformation. 3. Patent carotid and vertebral arteries. No dissection, aneurysm, or  hemodynamically significant stenosis utilizing NASCET criteria. These results were called by telephone at the time of interpretation on 03/18/2018 at 1:36 am to Dr. Ritta Slot , who verbally acknowledged these results. Electronically Signed   By: Mitzi Hansen M.D.   On: 03/18/2018 01:43   Ct Angio Neck W Or Wo Contrast  Result Date: 03/18/2018 CLINICAL DATA:  52 y/o F; altered mental status, in character in speech, left gaze. EXAM: CT ANGIOGRAPHY HEAD AND NECK TECHNIQUE: Multidetector CT imaging of the head and neck was performed using the standard protocol during bolus administration of intravenous contrast. Multiplanar CT image reconstructions and MIPs were obtained to evaluate the vascular anatomy. Carotid stenosis measurements (when applicable) are obtained utilizing NASCET criteria, using the distal internal carotid diameter as the denominator. CONTRAST:  50 cc Isovue 370 COMPARISON:  03/18/2018 CT head. FINDINGS: CTA NECK FINDINGS Aortic arch: Bovine variant branching. Imaged portion shows no evidence of aneurysm or dissection. No significant stenosis of the major arch vessel origins. Right carotid system: No evidence of dissection, stenosis (50% or greater) or occlusion. Asymmetrically small in caliber likely due to decreased blood flow from multiple chronic infarctions in the right cerebral hemisphere. Left carotid system: No evidence of dissection, stenosis (50% or greater) or occlusion. Vertebral arteries: Left dominant. No evidence of dissection, stenosis (50% or greater) or occlusion. Skeleton: Mild cervical spondylosis and reversal of cervical curvature. No high-grade bony canal stenosis. Other neck: Prominent right lower posterior cervical and left axillary lymph nodes of uncertain significance. Upper chest: Negative. Review of the MIP images confirms the above findings CTA HEAD FINDINGS Anterior circulation: Left M2 inferior division approximately 1 cm length occlusion at the  level of mid insula (series 12, image 28) with reconstitution of the vessel downstream. Small caliber right internal carotid artery and MCA distribution likely due to decreased blood flow from extensive infarction. Patent right MCA and bilateral ACA without large vessel occlusion. No aneurysm or vascular malformation in the anterior circulation. Posterior circulation: No significant stenosis, proximal occlusion, aneurysm, or vascular malformation. Venous sinuses: As permitted by contrast timing, patent. Anatomic variants: None significant. Review of the MIP images confirms the above findings IMPRESSION: 1. Left M2 inferior division approximately 1 cm occlusion at level of mid insula with reconstitution of downstream vessel. 2. No additional intracranial large vessel occlusion, aneurysm, high-grade stenosis, or vascular malformation. 3. Patent carotid and vertebral arteries. No dissection, aneurysm, or hemodynamically significant stenosis utilizing NASCET criteria. These results were called by telephone at the time of interpretation on 03/18/2018 at 1:36 am to Dr. Ritta Slot , who verbally acknowledged these results. Electronically Signed   By: Micah Noel  [place] M.D.   On: 03/18/2018 01:43   Ct Head Code Stroke Wo Contrast  Result Date: 03/18/2018 CLINICAL DATA:  Code stroke. 52 y/o F; a incoherent speech, combative, left-sided gaze. EXAM: CT HEAD WITHOUT CONTRAST TECHNIQUE: Contiguous axial images were obtained from the base of the skull through the vertex without intravenous contrast. COMPARISON:  None. FINDINGS: Brain: Extensive encephalomalacia throughout the right MCA distribution with ex vacuo dilatation of the right lateral ventricle. There additional small foci of encephalomalacia within the bilateral posterior temporal lobes and the left superior parietal lobe. Findings are compatible with multiple chronic infarctions. There is wallerian degeneration of the right cerebral peduncle and  hemi brainstem. No acute stroke, hemorrhage, or focal mass effect is identified. No herniation, extra-axial collection, or effacement of basilar cisterns. Incidental partially empty sella turcica. Vascular: No hyperdense vessel or unexpected calcification. Skull: Normal. Negative for fracture or focal lesion. Sinuses/Orbits: No acute finding. Other: None. ASPECTS [facility] Stroke Program Early CT Score) - Ganglionic level infarction (caudate, lentiform nuclei, internal capsule, insula, M1-M3 cortex): 7 - Supraganglionic infarction (M4-M6 cortex): 3 Total score (0-10 with 10 being normal): 10 IMPRESSION: 1. No acute intracranial abnormality identified. 2. ASPECTS is 10 3. Large chronic right MCA distribution infarct and additional small chronic infarcts in the bilateral posterior temporal lobes in the left superior parietal lobe. These results were called by telephone at the time of interpretation on 03/18/2018 at 1:11 am to Dr. Amada Jupiter, who verbally acknowledged these results. Electronically Signed   By: Mitzi Hansen M.D.   On: 03/18/2018 01:21    Procedures Procedures  CRITICAL CARE Performed by: Dione Booze Total critical care time: 45 minutes Critical care time was exclusive of separately billable procedures and treating other patients. Critical care was necessary to treat or prevent imminent or life-threatening deterioration. Critical care was time spent personally by me on the following activities: development of treatment plan with patient and/or surrogate as well as nursing, discussions with consultants, evaluation of patient's response to treatment, examination of patient, obtaining history from patient or surrogate, ordering and performing treatments and interventions, ordering and review of laboratory studies, ordering and review of radiographic studies, pulse oximetry and re-evaluation of patient's condition.  Medications Ordered in ED Medications  0.9 %  sodium chloride  infusion ( Intravenous New Bag/Given 03/18/18 0311)  acetaminophen (TYLENOL) tablet 650 mg (has no administration in time range)    Or  acetaminophen (TYLENOL) solution 650 mg (has no administration in time range)    Or  acetaminophen (TYLENOL) suppository 650 mg (has no administration in time range)  famotidine (PEPCID) IVPB 20 mg premix (20 mg Intravenous New Bag/Given 03/18/18 0352)  LORazepam (ATIVAN) 2 MG/ML injection (  Given 03/18/18 0115)  iopamidol (ISOVUE-370) 76 % injection 50 mL (50 mLs Intravenous Contrast Given 03/18/18 0124)  alteplase (ACTIVASE) 1 mg/mL infusion 76.7 mg (0 mg/kg  85.2 kg (Order-Specific) Intravenous Stopped 03/18/18 0235)   stroke: mapping our early stages of recovery book ( Does not apply Given 03/18/18 0352)  LORazepam (ATIVAN) injection 1 mg (1 mg Intravenous Given 03/18/18 0213)     Initial Impression / Assessment and Plan / ED Course  I have reviewed the triage vital signs and the nursing notes.  Pertinent labs & imaging results that were available during my care of the patient were reviewed by me and considered in my medical decision making (see chart for details).  Probable stroke with a aphasia and right-sided neglect.  She is sent for emergent CT scan  which shows evidence of prior right hemisphere stroke but no acute bleed.  Patient is seen in conjunction with Dr. Amada Jupiter of neurology service.  CT head was negative for acute process, old right-sided infarct present.  CT angiogram obtained showing occlusion of left M2 inferior division which probably accounts for her symptoms.  Dr. Amada Jupiter has arranged for TPA administration.  Final Clinical Impressions(s) / ED Diagnoses   Final diagnoses:  Cerebrovascular accident (CVA), unspecified mechanism (HCC)  Elevated troponin I level  Microcytic anemia  Hypokalemia    ED Discharge Orders    None       Dione Booze, MD 03/18/18 [phone]

## 2018-03-18 NOTE — ED Notes (Signed)
Patient arrived at CT scan with Dr. Petra Kuba and RN .

## 2018-03-18 NOTE — Evaluation (Signed)
Clinical/Bedside Swallow Evaluation Patient Details  Name: Whitney Rose MRN: [id number] Date of Birth: April 05, 1966  Today's Date: 03/18/2018 Time: SLP Start Time (ACUTE ONLY): 1000 SLP Stop Time (ACUTE ONLY): 1030 SLP Time Calculation (min) (ACUTE ONLY): 30 min  Past Medical History:  Past Medical History:  Diagnosis Date  . Eczema   . Sickle cell trait (HCC)   . Stroke [facility])    Past Surgical History:  Past Surgical History:  Procedure Laterality Date  . CHOLECYSTECTOMY  2001   HPI:  Whitney Rose is a 52 y.o. female with a history of  Previous stroke with subsequent left arm weakness who was last know well shortly after 10pm. She  had a witnessed change with decreased speech and subsequently was brought to the ER by EMS.  On arrival it was noted that she had significant aphasia.  Was given IV tPA. At baseline, she is unable to be left alone, needs assistance.  She is able to communicate and interact, however.  She is able to walk, but often drags her left leg.   Assessment / Plan / Recommendation Clinical Impression  Pt demonstrates no evidence of dysphagia with regualr solids or thin liquids. Will initaite a diet and sign off for swallowing. See next note for f/u regarding speech/language SLP Visit Diagnosis: Dysphagia, unspecified (R13.10)    Aspiration Risk       Diet Recommendation Regular;Thin liquid   Liquid Administration via: Cup;Straw Medication Administration: Whole meds with liquid Supervision: Patient able to self feed    Other  Recommendations     Follow up Recommendations        Frequency and Duration            Prognosis        Swallow Study   General HPI: Whitney Rose is a 52 y.o. female with a history of  Previous stroke with subsequent left arm weakness who was last know well shortly after 10pm. She  had a witnessed change with decreased speech and subsequently was brought to the ER by EMS.  On arrival it was noted that she had significant  aphasia.  Was given IV tPA. At baseline, she is unable to be left alone, needs assistance.  She is able to communicate and interact, however.  She is able to walk, but often drags her left leg. Type of Study: Bedside Swallow Evaluation Diet Prior to this Study: NPO Temperature Spikes Noted: No Respiratory Status: Room air History of Recent Intubation: No Behavior/Cognition: Alert;Cooperative;Pleasant mood Oral Care Completed by SLP: No Oral Cavity - Dentition: Adequate natural dentition Vision: (possible left and right field cut) Self-Feeding Abilities: Able to feed self Patient Positioning: Upright in bed Baseline Vocal Quality: Other (comment);Normal(difficulty modulating volume, pitch) Volitional Cough: Strong Volitional Swallow: Able to elicit    Oral/Motor/Sensory Function Overall Oral Motor/Sensory Function: Within functional limits   Ice Chips     Thin Liquid Thin Liquid: Within functional limits Presentation: Cup;Straw;Self Fed    Nectar Thick Nectar Thick Liquid: Not tested   Honey Thick Honey Thick Liquid: Not tested   Puree Puree: Within functional limits   Solid     Solid: Within functional limits     Harlon Ditty, MA CCC-SLP  Acute Rehabilitation Services Pager [phone] Office [phone]  Jozef Eisenbeis, Riley Nearing 03/18/2018,10:40 AM

## 2018-03-18 NOTE — Progress Notes (Signed)
Occupational Therapy Evaluation Patient Details Name: Whitney Rose MRN: [id number] DOB: 03-Jun-1966 Today's Date: 03/18/2018    History of Present Illness Pt had stroke in 12/2014 in [place]. Since then as per daughter, she had 6 strokes all in [place]. She had admitted to [facility] and [facility] in the past. P admitted with Patient arrived with EMS from home reports sudden onset altered mental status with slurred speech and left side gaze. CT head showed chronic large right MCA, small ACA and PCA infarcts as well as left small parietal infarcts. CTA head and neck showed left M2 occlusion with distal reconstitution. She received tPA and admitted to ICU.Marland Kitchen MRI ordered.    Clinical Impression   PTA, pt lived at home with her family and was modified independent with mobility without an AD and required occasional A with ADL tasks, mostly dressing due to apparent L sided weakness and whay sounds like visual perceptual deficits.  Family manages her finances and medications. Pt able to ambulate with min A initially, then min guard A. Requires min A with UB ADL and mod A with LB ADL. Pt demonstrates apparent communication deficits in addition to deficits with motor planning with unfamiliar tasks. At this time recommend follow up with [facility] and 24/7 S, which family states they can provide. Will follow acutely to facilitate safe DC home.     Follow Up Recommendations  Home health OT;Supervision/Assistance - 24 hour    Equipment Recommendations  3 in 1 bedside commode    Recommendations for Other Services       Precautions / Restrictions Precautions Precautions: Fall      Mobility Bed Mobility Overal bed mobility: Needs Assistance Bed Mobility: Supine to Sit     Supine to sit: Supervision        Transfers Overall transfer level: Needs assistance Equipment used: 2 person hand held assist Transfers: Sit to/from Stand Sit to Stand: Min assist         General  transfer comment: steady support given    Balance Overall balance assessment: Needs assistance   Sitting balance-Leahy Scale: Good       Standing balance-Leahy Scale: Fair                             ADL either performed or assessed with clinical judgement   ADL Overall ADL's : Needs assistance/impaired Eating/Feeding: Set up;Supervision/ safety;Sitting Eating/Feeding Details (indicate cue type and reason): assistance with opening packages; may benefit from plate guard Grooming: Moderate assistance   Upper Body Bathing: Moderate assistance   Lower Body Bathing: Moderate assistance;Sit to/from stand   Upper Body Dressing : Moderate assistance;Sitting   Lower Body Dressing: Moderate assistance;Sit to/from stand   Toilet Transfer: Minimal assistance;Ambulation     Toileting - Clothing Manipulation Details (indicate cue type and reason): foley; pt not incontnent at basleine     Functional mobility during ADLs: Minimal assistance       Vision Patient Visual Report: Peripheral vision impairment Vision Assessment?: Vision impaired- to be further tested in functional context Additional Comments: Appeasr to have L visual field cut. Most likley baseline; will further assess     Perception Perception Comments: visual perception impaired; poor awareness of space; most likley L inattention at baseline   Praxis Praxis Praxis tested?: Deficits Praxis-Other Comments: family reports difficulty with orienting clothes correctly at baesline; cues to sequence unfamiliar tasks    Pertinent Vitals/Pain Pain Assessment: No/denies  pain     Hand Dominance Right(ambidextrious)   Extremity/Trunk Assessment Upper Extremity Assessment Upper Extremity Assessment: LUE deficits/detail LUE Deficits / Details: hemiparetic at baseline; flexor tone greater distally; moves in flexor synergy pattern; hand fisted at times LUE Coordination: (does not user functionally per family)    Lower Extremity Assessment Lower Extremity Assessment: Defer to PT evaluation   Cervical / Trunk Assessment Cervical / Trunk Assessment: Other exceptions Cervical / Trunk Exceptions: lateral trunk difference from previous CVA   Communication Communication Communication: Expressive difficulties   Cognition Arousal/Alertness: Awake/alert Behavior During Therapy: Impulsive Overall Cognitive Status: Impaired/Different from baseline Area of Impairment: Attention;Safety/judgement;Awareness;Problem solving                   Current Attention Level: Selective     Safety/Judgement: Decreased awareness of safety;Decreased awareness of deficits Awareness: Emergent Problem Solving: Slow processing;Requires verbal cues     General Comments       Exercises     Shoulder Instructions      Home Living Family/patient expects to be discharged to:: Private residence   Available Help at Discharge: Family;Available 24 hours/day Type of Home: House Home Access: Stairs to enter [employer] of Steps: 1   Home Layout: Two level;Bed/bath upstairs Alternate Level Stairs-Number of Steps: flight Alternate Level Stairs-Rails: Right Bathroom Shower/Tub: Producer, television/film/video: Standard Bathroom Accessibility: Yes How Accessible: Accessible via walker Home Equipment: None      Lives With: Daughter(also lives in [place] part of the year)    Prior Functioning/Environment Level of Independence: Needs assistance  Gait / Transfers Assistance Needed: Ambulated without assistnace; family assisted into shower ADL's / Homemaking Assistance Needed: Pt bathed herself but required assistnace with dressing due to "putting her clothes on inside out/backwards" Communication / Swallowing Assistance Needed: no difficulty at baseline          OT Problem List: Decreased strength;Decreased range of motion;Decreased activity tolerance;Impaired balance (sitting and/or  standing);Impaired vision/perception;Decreased coordination;Decreased cognition;Decreased safety awareness;Decreased knowledge of use of DME or AE;Decreased knowledge of precautions;Cardiopulmonary status limiting activity;Impaired sensation;Obesity;Impaired UE functional use      OT Treatment/Interventions: Self-care/ADL training;Therapeutic exercise;Neuromuscular education;DME and/or AE instruction;Therapeutic activities;Splinting;Cognitive remediation/compensation;Visual/perceptual remediation/compensation;Patient/family education;Balance training    OT Goals(Current goals can be found in the care plan section) Acute Rehab OT Goals Patient Stated Goal: per family to return home OT Goal Formulation: With patient/family Time For Goal Achievement: 04/01/18 Potential to Achieve Goals: Good  OT Frequency: Min 2X/week   Barriers to D/C:            Co-evaluation PT/OT/SLP Co-Evaluation/Treatment: Yes Reason for Co-Treatment: Necessary to address cognition/behavior during functional activity;To address functional/ADL transfers   OT goals addressed during session: ADL's and self-care      AM-PAC PT "6 Clicks" Daily Activity     Outcome Measure Help from another person eating meals?: A Little Help from another person taking care of personal grooming?: A Little Help from another person toileting, which includes using toliet, bedpan, or urinal?: A Lot Help from another person bathing (including washing, rinsing, drying)?: A Lot Help from another person to put on and taking off regular upper body clothing?: A Lot Help from another person to put on and taking off regular lower body clothing?: A Lot 6 Click Score: 14   End of Session Equipment Utilized During Treatment: Gait belt Nurse Communication: Mobility status  Activity Tolerance: Patient tolerated treatment well Patient left: in chair;with call bell/phone within reach;with bed alarm set;with family/visitor present  OT Visit  Diagnosis: Unsteadiness on feet (R26.81);Muscle weakness (generalized) (M62.81);Low vision, both eyes (H54.2);Other symptoms and signs involving cognitive function;Cognitive communication deficit (R41.841);Hemiplegia and hemiparesis Symptoms and signs involving cognitive functions: Cerebral infarction(remote R CVA) Hemiplegia - Right/Left: Left Hemiplegia - dominant/non-dominant: Dominant Hemiplegia - caused by: Cerebral infarction                Time: 1424-1500 OT Time Calculation (min): 36 min Charges:  OT General Charges $OT Visit: 1 Visit OT Evaluation $OT Eval Moderate Complexity: 1 Mod  Luka Stohr, OT/L   Acute OT Clinical Specialist Acute Rehabilitation Services Pager [phone] Office [phone]   [facility] 03/18/2018, 3:40 PM

## 2018-03-18 NOTE — Progress Notes (Signed)
  Echocardiogram 2D Echocardiogram has been performed.  Whitney Rose 03/18/2018, 2:37 PM

## 2018-03-19 ENCOUNTER — Other Ambulatory Visit: Payer: Self-pay

## 2018-03-19 ENCOUNTER — Encounter (HOSPITAL_COMMUNITY): Payer: Self-pay

## 2018-03-19 DIAGNOSIS — I10 Essential (primary) hypertension: Secondary | ICD-10-CM

## 2018-03-19 DIAGNOSIS — I63312 Cerebral infarction due to thrombosis of left middle cerebral artery: Secondary | ICD-10-CM

## 2018-03-19 DIAGNOSIS — I69320 Aphasia following cerebral infarction: Secondary | ICD-10-CM

## 2018-03-19 DIAGNOSIS — I6300 Cerebral infarction due to thrombosis of unspecified precerebral artery: Secondary | ICD-10-CM

## 2018-03-19 LAB — BASIC METABOLIC PANEL
ANION GAP: 7 (ref 5–15)
BUN: 5 mg/dL — ABNORMAL LOW (ref 6–20)
CALCIUM: 8.4 mg/dL — AB (ref 8.9–10.3)
CO2: 21 mmol/L — ABNORMAL LOW (ref 22–32)
Chloride: 112 mmol/L — ABNORMAL HIGH (ref 98–111)
Creatinine, Ser: 0.68 mg/dL (ref 0.44–1.00)
GFR calc non Af Amer: >60 mL/min (ref >60)
GLUCOSE: 88 mg/dL (ref 70–99)
POTASSIUM: 3.1 mmol/L — AB (ref 3.5–5.1)
Sodium: 140 mmol/L (ref 135–145)

## 2018-03-19 LAB — IRON AND TIBC
Iron: 16 ug/dL — ABNORMAL LOW (ref 28–170)
SATURATION RATIOS: 5 % — AB (ref 10.4–31.8)
TIBC: 322 ug/dL (ref 250–450)
UIBC: 306 ug/dL

## 2018-03-19 LAB — HEMOGLOBINOPATHY EVALUATION
HGB S QUANTITAION: 30.7 % — AB
HGB VARIANT: 0 %
Hgb A2 Quant: 3.2 % (ref 1.8–3.2)
Hgb A: 66.1 % — ABNORMAL LOW (ref 96.4–98.8)
Hgb C: 0 %
Hgb F Quant: 0 % (ref 0.0–2.0)

## 2018-03-19 LAB — CBC
HEMATOCRIT: 27.4 % — AB (ref 36.0–46.0)
HEMOGLOBIN: 8.4 g/dL — AB (ref 12.0–15.0)
MCH: 21.2 pg — ABNORMAL LOW (ref 26.0–34.0)
MCHC: 30.7 g/dL (ref 30.0–36.0)
MCV: 69 fL — ABNORMAL LOW (ref 80.0–100.0)
NRBC: 0 % (ref 0.0–0.2)
Platelets: 253 10*3/uL (ref 150–400)
RBC: 3.97 MIL/uL (ref 3.87–5.11)
RDW: 18.6 % — AB (ref 11.5–15.5)
WBC: 7.9 10*3/uL (ref 4.0–10.5)

## 2018-03-19 LAB — FERRITIN: Ferritin: 7 ng/mL — ABNORMAL LOW (ref 11–307)

## 2018-03-19 MED ORDER — FAMOTIDINE 20 MG PO TABS
20.0000 mg | ORAL_TABLET | Freq: Two times a day (BID) | ORAL | Status: DC
  Administered 2018-03-19: 20 mg via ORAL
  Filled 2018-03-19: qty 1

## 2018-03-19 MED ORDER — ASPIRIN EC 81 MG PO TBEC
81.0000 mg | DELAYED_RELEASE_TABLET | Freq: Every day | ORAL | Status: DC
  Administered 2018-03-19: 81 mg via ORAL
  Filled 2018-03-19: qty 1

## 2018-03-19 MED ORDER — ASPIRIN 81 MG PO TBEC: 81.0000 mg | DELAYED_RELEASE_TABLET | Freq: Every day | ORAL | 0 refills | Status: AC

## 2018-03-19 MED ORDER — ATORVASTATIN CALCIUM 40 MG PO TABS: 20.0000 mg | ORAL_TABLET | Freq: Every day | ORAL | 0 refills | Status: AC

## 2018-03-19 MED ORDER — CLOPIDOGREL BISULFATE 75 MG PO TABS
75.0000 mg | ORAL_TABLET | Freq: Every day | ORAL | Status: DC
  Administered 2018-03-19: 75 mg via ORAL
  Filled 2018-03-19: qty 1

## 2018-03-19 NOTE — Discharge Summary (Addendum)
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 Eczema   . Sickle cell trait (HCC)   . Stroke [facility])    Past Surgical History:  Procedure Laterality Date  . CHOLECYSTECTOMY  2001    Allergies as of 03/19/2018   No Known Allergies     Medication List    STOP taking these medications   triamcinolone cream 0.1 % Commonly known as:  KENALOG     TAKE these medications   aspirin 81 MG EC tablet Take 1 tablet (81 mg total) by mouth daily. Stop taking after 21 days   atorvastatin 40 MG tablet Commonly known as:  LIPITOR Take 0.5 tablets (20 mg total) by mouth daily. What changed:  how much to take   baclofen 10 MG tablet Commonly known as:  LIORESAL Take 10 mg by mouth 3 (three) times daily.   citalopram 10 MG tablet Commonly known as:  CELEXA Take 10 mg by mouth daily.   clopidogrel 75 MG tablet Commonly known as:  PLAVIX Take 75 mg by mouth daily.   metoprolol succinate 25 MG 24 hr tablet Commonly known as:  TOPROL-XL Take 25 mg by mouth daily.   Vitamin D (Ergocalciferol) 50000 units Caps capsule Commonly known as:  DRISDOL Take 50,000 Units by mouth every 7 (seven) days.       LABORATORY STUDIES CBC    Component Value Date/Time   WBC 7.9 03/19/2018 0316   RBC 3.97 03/19/2018 0316   HGB 8.4 (L) 03/19/2018 0316   HCT 27.4 (L) 03/19/2018 0316   PLT 253 03/19/2018 0316   MCV 69.0 (L) 03/19/2018 0316   MCH 21.2 (L)  03/19/2018 0316   MCHC 30.7 03/19/2018 0316   RDW 18.6 (H) 03/19/2018 0316   LYMPHSABS 3.0 03/18/2018 0117   MONOABS 0.9 03/18/2018 0117   EOSABS 0.1 03/18/2018 0117   BASOSABS 0.0 03/18/2018 0117   CMP    Component Value Date/Time   NA 140 03/19/2018 0316   K 3.1 (L) 03/19/2018 0316   CL 112 (H) 03/19/2018 0316   CO2 21 (L) 03/19/2018 0316   GLUCOSE 88 03/19/2018 0316   BUN 5 (L) 03/19/2018 0316   CREATININE 0.68 03/19/2018 0316   CREATININE 0.68 10/25/2011 1143   CALCIUM 8.4 (L) 03/19/2018 0316   PROT 7.5 03/18/2018 0117   ALBUMIN 3.4 (L) 03/18/2018 0117   AST 34 03/18/2018 0117   ALT 37 03/18/2018 0117   ALKPHOS 86 03/18/2018 0117   BILITOT 0.6 03/18/2018 0117   GFRNONAA >60 03/19/2018 0316   GFRAA >60 03/19/2018 0316   COAGS Lab Results  Component Value Date   INR 1.07 03/18/2018   Lipid Panel    Component Value Date/Time   CHOL 99 03/18/2018 0117   TRIG 67 03/18/2018 0117   HDL 40 (L) 03/18/2018 0117   CHOLHDL 2.5 03/18/2018 0117   VLDL 13 03/18/2018 0117  LDLCALC 46 03/18/2018 0117   HgbA1C  Lab Results  Component Value Date   HGBA1C 5.5 03/18/2018   Urinalysis    Component Value Date/Time   COLORURINE STRAW (A) 03/18/2018 0147   APPEARANCEUR CLEAR 03/18/2018 0147   LABSPEC 1.021 03/18/2018 0147   PHURINE 6.0 03/18/2018 0147   GLUCOSEU NEGATIVE 03/18/2018 0147   HGBUR NEGATIVE 03/18/2018 0147   BILIRUBINUR NEGATIVE 03/18/2018 0147   KETONESUR NEGATIVE 03/18/2018 0147   PROTEINUR NEGATIVE 03/18/2018 0147   NITRITE NEGATIVE 03/18/2018 0147   LEUKOCYTESUR NEGATIVE 03/18/2018 0147   Urine Drug Screen     Component Value Date/Time   LABOPIA NONE DETECTED 03/18/2018 0117   COCAINSCRNUR NONE DETECTED 03/18/2018 0117   LABBENZ NONE DETECTED 03/18/2018 0117   AMPHETMU NONE DETECTED 03/18/2018 0117   THCU NONE DETECTED 03/18/2018 0117   LABBARB NONE DETECTED 03/18/2018 0117    Alcohol Level    Component Value Date/Time   ETH <10 03/18/2018  0117     SIGNIFICANT DIAGNOSTIC STUDIES Ct Angio Head W Or Wo Contrast  Result Date: 03/18/2018 CLINICAL DATA:  52 y/o F; altered mental status, in character in speech, left gaze. EXAM: CT ANGIOGRAPHY HEAD AND NECK TECHNIQUE: Multidetector CT imaging of the head and neck was performed using the standard protocol during bolus administration of intravenous contrast. Multiplanar CT image reconstructions and MIPs were obtained to evaluate the vascular anatomy. Carotid stenosis measurements (when applicable) are obtained utilizing NASCET criteria, using the distal internal carotid diameter as the denominator. CONTRAST:  50 cc Isovue 370 COMPARISON:  03/18/2018 CT head. FINDINGS: CTA NECK FINDINGS Aortic arch: Bovine variant branching. Imaged portion shows no evidence of aneurysm or dissection. No significant stenosis of the major arch vessel origins. Right carotid system: No evidence of dissection, stenosis (50% or greater) or occlusion. Asymmetrically small in caliber likely due to decreased blood flow from multiple chronic infarctions in the right cerebral hemisphere. Left carotid system: No evidence of dissection, stenosis (50% or greater) or occlusion. Vertebral arteries: Left dominant. No evidence of dissection, stenosis (50% or greater) or occlusion. Skeleton: Mild cervical spondylosis and reversal of cervical curvature. No high-grade bony canal stenosis. Other neck: Prominent right lower posterior cervical and left axillary lymph nodes of uncertain significance. Upper chest: Negative. Review of the MIP images confirms the above findings CTA HEAD FINDINGS Anterior circulation: Left M2 inferior division approximately 1 cm length occlusion at the level of mid insula (series 12, image 28) with reconstitution of the vessel downstream. Small caliber right internal carotid artery and MCA distribution likely due to decreased blood flow from extensive infarction. Patent right MCA and bilateral ACA without large  vessel occlusion. No aneurysm or vascular malformation in the anterior circulation. Posterior circulation: No significant stenosis, proximal occlusion, aneurysm, or vascular malformation. Venous sinuses: As permitted by contrast timing, patent. Anatomic variants: None significant. Review of the MIP images confirms the above findings IMPRESSION: 1. Left M2 inferior division approximately 1 cm occlusion at level of mid insula with reconstitution of downstream vessel. 2. No additional intracranial large vessel occlusion, aneurysm, high-grade stenosis, or vascular malformation. 3. Patent carotid and vertebral arteries. No dissection, aneurysm, or hemodynamically significant stenosis utilizing NASCET criteria. These results were called by telephone at the time of interpretation on 03/18/2018 at 1:36 am to Dr. Ritta Slot , who verbally acknowledged these results. Electronically Signed   By: Mitzi Hansen M.D.   On: 03/18/2018 01:43   Ct Angio Neck W Or Wo Contrast  Result Date: 03/18/2018 CLINICAL  DATA:  52 y/o F; altered mental status, in character in speech, left gaze. EXAM: CT ANGIOGRAPHY HEAD AND NECK TECHNIQUE: Multidetector CT imaging of the head and neck was performed using the standard protocol during bolus administration of intravenous contrast. Multiplanar CT image reconstructions and MIPs were obtained to evaluate the vascular anatomy. Carotid stenosis measurements (when applicable) are obtained utilizing NASCET criteria, using the distal internal carotid diameter as the denominator. CONTRAST:  50 cc Isovue 370 COMPARISON:  03/18/2018 CT head. FINDINGS: CTA NECK FINDINGS Aortic arch: Bovine variant branching. Imaged portion shows no evidence of aneurysm or dissection. No significant stenosis of the major arch vessel origins. Right carotid system: No evidence of dissection, stenosis (50% or greater) or occlusion. Asymmetrically small in caliber likely due to decreased blood flow from  multiple chronic infarctions in the right cerebral hemisphere. Left carotid system: No evidence of dissection, stenosis (50% or greater) or occlusion. Vertebral arteries: Left dominant. No evidence of dissection, stenosis (50% or greater) or occlusion. Skeleton: Mild cervical spondylosis and reversal of cervical curvature. No high-grade bony canal stenosis. Other neck: Prominent right lower posterior cervical and left axillary lymph nodes of uncertain significance. Upper chest: Negative. Review of the MIP images confirms the above findings CTA HEAD FINDINGS Anterior circulation: Left M2 inferior division approximately 1 cm length occlusion at the level of mid insula (series 12, image 28) with reconstitution of the vessel downstream. Small caliber right internal carotid artery and MCA distribution likely due to decreased blood flow from extensive infarction. Patent right MCA and bilateral ACA without large vessel occlusion. No aneurysm or vascular malformation in the anterior circulation. Posterior circulation: No significant stenosis, proximal occlusion, aneurysm, or vascular malformation. Venous sinuses: As permitted by contrast timing, patent. Anatomic variants: None significant. Review of the MIP images confirms the above findings IMPRESSION: 1. Left M2 inferior division approximately 1 cm occlusion at level of mid insula with reconstitution of downstream vessel. 2. No additional intracranial large vessel occlusion, aneurysm, high-grade stenosis, or vascular malformation. 3. Patent carotid and vertebral arteries. No dissection, aneurysm, or hemodynamically significant stenosis utilizing NASCET criteria. These results were called by telephone at the time of interpretation on 03/18/2018 at 1:36 am to Dr. Ritta Slot , who verbally acknowledged these results. Electronically Signed   By: Mitzi Hansen M.D.   On: 03/18/2018 01:43   Mr Maxine Glenn Head Wo Contrast  Result Date: 03/19/2018 CLINICAL DATA:   52 y/o  F; aphasia post tPA for follow-up. EXAM: MRI HEAD WITHOUT CONTRAST MRA HEAD WITHOUT CONTRAST TECHNIQUE: Multiplanar, multiecho pulse sequences of the brain and surrounding structures were obtained without intravenous contrast. Angiographic images of the head were obtained using MRA technique without contrast. COMPARISON:  03/18/2018 CT head and CTA head. FINDINGS: MRI HEAD FINDINGS Brain: Cortical reduced diffusion involving the left posterior insula, posterolateral temporal lobe, and lateral parietal lobe compatible with acute/early subacute infarction. No associated hemorrhage or mass effect. Extensive encephalomalacia in the right MCA distribution compatible chronic infarction with wallerian degeneration of the right cerebral peduncle and hemi brainstem. Additional small chronic cortical infarctions are present within the posterior temporal lobes and left superior parietal lobe. 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 There is wallerian degeneration of the right lateral ventricle. No extra-axial collection or herniation. Partially empty sella turcica. Vascular: As below. Skull and upper cervical spine: Normal marrow signal. Sinuses/Orbits: Negative. Other: None. MRA HEAD FINDINGS Internal carotid arteries:  Patent. Anterior cerebral arteries: Patent. Left  proximal A2 segment of mild-to-moderate stenosis. Middle cerebral arteries: Small caliber right MCA distribution likely due to decreased blood flow from large right MCA chronic infarction. Patent left MCA distribution with reconstituted segment of occlusion in the distal left M2 inferior division. No new large vessel occlusion. Posterior cerebral arteries: Patent. Segments of stenosis and mildly asymmetrically decreased flow related signal in the right distal PCA distribution probably related to chronic atherosclerotic disease. Basilar artery:  Patent. Vertebral arteries:  Patent. No  new large vessel occlusion or aneurysm. IMPRESSION: 1. Left posterior MCA distribution cortical reduced diffusion compatible with acute/early subacute infarction. No associated acute hemorrhage or mass effect. 2. Occluded segment of distal left M2 inferior division on prior CTA of head is now patent. No new large vessel occlusion. 3. Large chronic right MCA distribution infarct and multiple additional small chronic cortical infarcts of the brain. Electronically Signed   By: Mitzi Hansen M.D.   On: 03/19/2018 00:04   Mr Brain Wo Contrast  Result Date: 03/19/2018 CLINICAL DATA:  52 y/o  F; aphasia post tPA for follow-up. EXAM: MRI HEAD WITHOUT CONTRAST MRA HEAD WITHOUT CONTRAST TECHNIQUE: Multiplanar, multiecho pulse sequences of the brain and surrounding structures were obtained without intravenous contrast. Angiographic images of the head were obtained using MRA technique without contrast. COMPARISON:  03/18/2018 CT head and CTA head. FINDINGS: MRI HEAD FINDINGS Brain: Cortical reduced diffusion involving the left posterior insula, posterolateral temporal lobe, and lateral parietal lobe compatible with acute/early subacute infarction. No associated hemorrhage or mass effect. Extensive encephalomalacia in the right MCA distribution compatible chronic infarction with wallerian degeneration of the right cerebral peduncle and hemi brainstem. Additional small chronic cortical infarctions are present within the posterior temporal lobes and left superior parietal lobe. 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 There is wallerian degeneration of the right lateral ventricle. No extra-axial collection or herniation. Partially empty sella turcica. Vascular: As below. Skull and upper cervical spine: Normal marrow signal. Sinuses/Orbits: Negative. Other: None. MRA HEAD FINDINGS Internal carotid arteries:  Patent. Anterior cerebral arteries:  Patent. Left proximal A2 segment of mild-to-moderate stenosis. Middle cerebral arteries: Small caliber right MCA distribution likely due to decreased blood flow from large right MCA chronic infarction. Patent left MCA distribution with reconstituted segment of occlusion in the distal left M2 inferior division. No new large vessel occlusion. Posterior cerebral arteries: Patent. Segments of stenosis and mildly asymmetrically decreased flow related signal in the right distal PCA distribution probably related to chronic atherosclerotic disease. Basilar artery:  Patent. Vertebral arteries:  Patent. No new large vessel occlusion or aneurysm. IMPRESSION: 1. Left posterior MCA distribution cortical reduced diffusion compatible with acute/early subacute infarction. No associated acute hemorrhage or mass effect. 2. Occluded segment of distal left M2 inferior division on prior CTA of head is now patent. No new large vessel occlusion. 3. Large chronic right MCA distribution infarct and multiple additional small chronic cortical infarcts of the brain. Electronically Signed   By: Mitzi Hansen M.D.   On: 03/19/2018 00:04   Ct Head Code Stroke Wo Contrast  Result Date: 03/18/2018 CLINICAL DATA:  Code stroke. 52 y/o F; a incoherent speech, combative, left-sided gaze. EXAM: CT HEAD WITHOUT CONTRAST TECHNIQUE: Contiguous axial images were obtained from the base of the skull through the vertex without intravenous contrast. COMPARISON:  None. FINDINGS: Brain: Extensive encephalomalacia throughout the right MCA distribution with ex vacuo dilatation of the right lateral ventricle. There additional small foci of encephalomalacia  within the bilateral posterior temporal lobes and the left superior parietal lobe. Findings are compatible with multiple chronic infarctions. There is wallerian degeneration of the right cerebral peduncle and hemi brainstem. No acute stroke, hemorrhage, or focal mass effect is identified. No  herniation, extra-axial collection, or effacement of basilar cisterns. Incidental partially empty sella turcica. Vascular: No hyperdense vessel or unexpected calcification. Skull: Normal. Negative for fracture or focal lesion. Sinuses/Orbits: No acute finding. Other: None. ASPECTS [facility] Stroke Program Early CT Score) - Ganglionic level infarction (caudate, lentiform nuclei, internal capsule, insula, M1-M3 cortex): 7 - Supraganglionic infarction (M4-M6 cortex): 3 Total score (0-10 with 10 being normal): 10 IMPRESSION: 1. No acute intracranial abnormality identified. 2. ASPECTS is 10 3. Large chronic right MCA distribution infarct and additional small chronic infarcts in the bilateral posterior temporal lobes in the left superior parietal lobe. These results were called by telephone at the time of interpretation on 03/18/2018 at 1:11 am to Dr. Amada Jupiter, who verbally acknowledged these results. Electronically Signed   By: Mitzi Hansen M.D.   On: 03/18/2018 01:21    TTE  - Left ventricle: The cavity size was normal. There was moderateconcentric hypertrophy. Systolic function was normal. Theestimated ejection fraction was in the range of 60% to 65%. Therewas dynamic obstruction at rest, with a peak velocity of 116cm/sec and a peak gradient of 5 mm Hg. Wall motion was normal;there were no regional wall motion abnormalities. Features areconsistent with a pseudonormal left ventricular filling pattern,with concomitant abnormal relaxation and increased fillingpressure (grade 2 diastolic dysfunction). - Aortic valve: Transvalvular velocity was within the normal range.There was no stenosis. There was no regurgitation. - Mitral valve: Transvalvular velocity was within the normal range.There was no evidence for stenosis. There was no regurgitation. - Right ventricle: The cavity size was normal. Wall thickness wasnormal. Systolic function was normal. - Atrial septum: No defect or patent foramen  ovale was identifiedby color flow Doppler. - Tricuspid valve: There was trivial regurgitation. - Pulmonary arteries: Systolic pressure was within the normalrange. PA peak pressure: 29 mm Hg (S). - Pericardium, extracardiac: A trivial pericardial effusion wasidentified.  LE venous doppler neg  EEG no seizure  TCD Low mean flow velocities in the right MCA, left vertebral and basilar arteries suggest mild stenosis. Globally elevated pulsatility indexes suggestive diffuse intracranial atherosclerosis likely  HISTORY OF PRESENT ILLNESS Whitney Rose is a 52 y.o. female with a history of  Previous stroke with subsequent left arm weakness who was last know well shortly after 10pm on 03/17/2018. She had a witnessed change with decreased speech and subsequently was brought to the ER by EMS.  On arrival it was noted that she had significant aphasia.  Dr. Amada Jupiter discussed the risks/benefits of IV TPA and after multiple discussions, calling 3 separate family members they did agreed to proceed with IV TPA. At baseline, she is unable to be left alone, needs assistance.  She is able to communicate and interact, however.  She is able to walk, but often drags her left leg. MRS: 3. She has had multiple strokes in the past treated at [facility].  She had a surgery/procedure of some type there about 1.5 years ago, though I am unclear of exactly what type of surgery was done.  A second surgery was recommended but the patient declined.  She has been maintained on Plavix. She was grossly combative on arrival, and had to be given a total of 2 mg of Ativan to obtain imaging. She was admitted to the neuro  ICU post tPA administration.   HOSPITAL COURSE Whitney Rose is a 52 y.o. female with history of multiple strokes and sickle cell trait/ presenting with speech difficulty and confusion. 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 Chronic, and small caliber of b/l ICAs  MRI  L posterior MCA distribution cortical infarction. Large chronic right MCA distribution infarct and multiple additional small chronic cortical infarcts of the brain.   MRA  occluded segment of distal left M2 inferior division on prior CTA of head is now patent. No new large vessel occlusion.   2D Echo  EF 60-65%. No source of embolus   EEG no seizure  LE venous doppler neg DVT  TCD mild stenosis R MCA, L VA and BA. Diffuse atherosclerosis   LDL 46  HgbA1c 5.5  HIV neg  Hcg neg  clopidogrel 75 mg daily prior to admission, post tPA started aspirin 81 mg and plavix 75 mg daily x 3 weeks, then PLAVIX alone.   Therapy recommendations:  HH PT and OT, SLP  Disposition:  Return home. Pt has someone with her both at home in [place] and in [place]  Consider [place] trial - [facility] to follow up  No Seizure   Daughter reported 3-4 episodes of confusion, lack of response, lost control of limbs and eye movement, shivering followed by weakness, resolved after taking juice or sweet  CT Chronic right large MCA and small ACA and PCA as well as left small parietal infarcts  EEG negative for seizure  Sickle cell Trait  Medical records have been requested from [facility] fax number left in the request attention to Dr. Delia Heady  Per pt/dtr, has sickle cell trait, not the disease. She has never had crisis. It has been evaluated per her. Await records from [facility]  Hb electrophoresis pending   TCD mild stenosis, diffuse atherosclerosis   Anemia with Hb 9.8->8.1->8.4  Microcytic anemia   Hb  9.8->8.1->8.4  Low MCV and MCH  Iron deficiency vs. Sickle cell   Fer 7 (low)  Fe 16 (low  TIBC 322 (normal)  Fe Sat 5 (low)  UIBC 306 (normal)smear - ovalocytes, polychromasia  Hb electrophoresis pending  Follow up with PCP for anemia  Hypertension   Stable  Resume home metoprolol at d/c  Hyperlipidemia, LDL 46, on lipitor 40 PTA, decreased to lipitor 20 at d/c for goal LDL < 70  Other Stroke Risk Factors  UDS, ETOH negative  Hx stroke/TIA - records requested - hx 6 strokes per dtr since 2016 (including this one). Resultant R HP. She could walk, normal speech. TEE done, negative. Had monitor at home which was neg. placed loop recorder, which is now out, negative.  Other Active Problems  Vascular cognitive impairment - needs/has help at home  hypokalemia  DISCHARGE EXAM per Dr. Pearlean Brownie Blood pressure 110/66, pulse 70, temperature 98.5 F (36.9 C), temperature source Oral, resp. rate 14, SpO2 100 %. General - Well nourished, well developed, in no apparent distress.  Ophthalmologic - fundi not visualized due to noncooperation.  Cardiovascular - Regular rate and rhythm.  Neuro - awake alert, eyes open, following all simple commands. Fluent speech but with significant paraphasic errors, and phonic replacement. Able to name and repeat but again with paraphasic errors. No significant neglect, attending to both sides, however, seems slight left gaze preference. PERRL, EOMI, blinking to visual threat on the right, inconsistent blinking to the left. Able to tracking without difficulty. Mild left nasolabial fold flattening. Tongue midline. Left UE 4/5 proximal and 3/5 distal. LLE and RLE and RUE 5/5. Sensation intact, no ataxia, gait not tested.    Discharge Diet   Heart healthy thin liquids  DISCHARGE PLAN  Disposition:  Home with family  Home health PT, OT and SLP  aspirin 81 mg daily and clopidogrel 75 mg daily for secondary stroke prevention x 3 weeks then  plavix alone.  Ongoing risk factor control by Primary Care Physician at time of discharge  Follow-up System, Pcp Not In in 2 weeks.  Follow-up in [facility] in 4 weeks, office to schedule an appointment.   40 minutes were spent preparing discharge.  Annie Main, MSN, APRN, ANVP-BC, AGPCNP-BC Advanced Practice Stroke Nurse [facility] See Amion for Schedule & Pager information 03/19/2018 2:17 PM  I have personally examined this patient, reviewed notes, independently viewed imaging studies, participated in medical decision making and plan of care.ROS completed by me personally and pertinent positives fully documented  I have made any additions or clarifications directly to the above note. Agree with note above.    Delia Heady, MD Medical Director [facility] Pager: [phone] 03/20/2018 5:45 PM

## 2018-03-19 NOTE — Progress Notes (Signed)
  Speech Language Pathology Treatment: Cognitive-Linquistic  Patient Details Name: Whitney Rose MRN: [id number] DOB: 03/21/1966 Today's Date: 03/19/2018 Time: 1100-1140 SLP Time Calculation (min) (ACUTE ONLY): 40 min  Assessment / Plan / Recommendation Clinical Impression  Pt seen to target accuracy with motor speech tasks. Substitutions and vowel distortions prevalent today. Pt able to name number and suit of cards "eight of hearts" with moderate articulatory models with better than 80% accuracy in a set of 52. Pts accuracy and fluency improved with repetitive task. Very unintelligible today with open task, pt frustrated. Recommend high frequency therapy in either outpatient or home health setting depending on pts needs. Family to discuss. Introduced cueing methods to daughter in order for her to assist with therapeutic interventions at a higher frequency.   HPI HPI: Whitney Rose is a 52 y.o. female with a history of  Previous stroke with subsequent left arm weakness who was last know well shortly after 10pm. She  had a witnessed change with decreased speech and subsequently was brought to the ER by EMS.  On arrival it was noted that she had significant aphasia.  Was given IV tPA. At baseline, she is unable to be left alone, needs assistance.  She is able to communicate and interact, however.  She is able to walk, but often drags her left leg.      SLP Plan  Continue with current plan of care       Recommendations                   Follow up Recommendations: Outpatient SLP SLP Visit Diagnosis: Apraxia (R48.2);Aphasia (R47.01) Plan: Continue with current plan of care       GO                Whitney Rose, Whitney Rose 03/19/2018, 12:21 PM

## 2018-03-19 NOTE — Plan of Care (Signed)
Pt ambulating this morning, up in chair. Vital signs stable. No issues overnight. Pt tolerating regular diet. No problems with urination or bowels. Plans to add additional medications and discharge to home today with home health. IVs to be removed. Daughter at bedside and voices understanding of education and plan of care. Will follow up with Dr. Pearlean Brownie and Neurologist managing care at [facility]. Patient will be wheeled to front and will go home with 2 daughters, 1 being her POA. Will continue to monitor patient until discharge.

## 2018-03-19 NOTE — Progress Notes (Addendum)
STROKE TEAM PROGRESS NOTE   SUBJECTIVE (INTERVAL HISTORY) Her daughter is at the bedside. Pt now speaking but mildly aphasic, difficult to make herself understood. Pt/family ok for discharge home this afternoon.   OBJECTIVE Temp:  [98.1 F (36.7 C)-98.8 F (37.1 C)] 98.3 F (36.8 C) (10/17 0400) Pulse Rate:  [49-72] 57 (10/17 0800) Cardiac Rhythm: Sinus bradycardia (10/17 0800) Resp:  [10-25] 17 (10/17 0800) BP: (88-162)/(36-74) 126/74 (10/17 0800) SpO2:  [98 %-100 %] 99 % (10/17 0800)  Recent Labs  Lab 03/18/18 0142  GLUCAP 82   Recent Labs  Lab 03/18/18 0117 03/18/18 0127 03/19/18 0316  NA 142 145 140  K 3.1* 3.1* 3.1*  CL 109 109 112*  CO2 22  --  21*  GLUCOSE 91 85 88  BUN 11 11 5*  CREATININE 1.03* 1.00 0.68  CALCIUM 9.5  --  8.4*   Recent Labs  Lab 03/18/18 0117  AST 34  ALT 37  ALKPHOS 86  BILITOT 0.6  PROT 7.5  ALBUMIN 3.4*   Recent Labs  Lab 03/18/18 0117 03/18/18 0127 03/18/18 0700 03/19/18 0316  WBC 9.3  --  9.8 7.9  NEUTROABS 5.3  --   --   --   HGB 9.8* 11.6* 8.1* 8.4*  HCT 33.5* 34.0* 27.9* 27.4*  MCV 69.9*  --  71.7* 69.0*  PLT 308  --  291 253   No results for input(s): CKTOTAL, CKMB, CKMBINDEX, TROPONINI in the last 168 hours. Recent Labs    03/18/18 0117  LABPROT 13.8  INR 1.07   Recent Labs    03/18/18 0147  COLORURINE STRAW*  LABSPEC 1.021  PHURINE 6.0  GLUCOSEU NEGATIVE  HGBUR NEGATIVE  BILIRUBINUR NEGATIVE  KETONESUR NEGATIVE  PROTEINUR NEGATIVE  NITRITE NEGATIVE  LEUKOCYTESUR NEGATIVE       Component Value Date/Time   CHOL 99 03/18/2018 0117   TRIG 67 03/18/2018 0117   HDL 40 (L) 03/18/2018 0117   CHOLHDL 2.5 03/18/2018 0117   VLDL 13 03/18/2018 0117   LDLCALC 46 03/18/2018 0117   Lab Results  Component Value Date   HGBA1C 5.5 03/18/2018      Component Value Date/Time   LABOPIA NONE DETECTED 03/18/2018 0117   COCAINSCRNUR NONE DETECTED 03/18/2018 0117   LABBENZ NONE DETECTED 03/18/2018 0117    AMPHETMU NONE DETECTED 03/18/2018 0117   THCU NONE DETECTED 03/18/2018 0117   LABBARB NONE DETECTED 03/18/2018 0117    Recent Labs  Lab 03/18/18 0117  ETH <10    Ct Angio Head W Or Wo Contrast  Result Date: 03/18/2018 CLINICAL DATA:  52 y/o F; altered mental status, in character in speech, left gaze. EXAM: CT ANGIOGRAPHY HEAD AND NECK TECHNIQUE: Multidetector CT imaging of the head and neck was performed using the standard protocol during bolus administration of intravenous contrast. Multiplanar CT image reconstructions and MIPs were obtained to evaluate the vascular anatomy. Carotid stenosis measurements (when applicable) are obtained utilizing NASCET criteria, using the distal internal carotid diameter as the denominator. CONTRAST:  50 cc Isovue 370 COMPARISON:  03/18/2018 CT head. FINDINGS: CTA NECK FINDINGS Aortic arch: Bovine variant branching. Imaged portion shows no evidence of aneurysm or dissection. No significant stenosis of the major arch vessel origins. Right carotid system: No evidence of dissection, stenosis (50% or greater) or occlusion. Asymmetrically small in caliber likely due to decreased blood flow from multiple chronic infarctions in the right cerebral hemisphere. Left carotid system: No evidence of dissection, stenosis (50% or greater) or occlusion. Vertebral arteries:  Left dominant. No evidence of dissection, stenosis (50% or greater) or occlusion. Skeleton: Mild cervical spondylosis and reversal of cervical curvature. No high-grade bony canal stenosis. Other neck: Prominent right lower posterior cervical and left axillary lymph nodes of uncertain significance. Upper chest: Negative. Review of the MIP images confirms the above findings CTA HEAD FINDINGS Anterior circulation: Left M2 inferior division approximately 1 cm length occlusion at the level of mid insula (series 12, image 28) with reconstitution of the vessel downstream. Small caliber right internal carotid artery and MCA  distribution likely due to decreased blood flow from extensive infarction. Patent right MCA and bilateral ACA without large vessel occlusion. No aneurysm or vascular malformation in the anterior circulation. Posterior circulation: No significant stenosis, proximal occlusion, aneurysm, or vascular malformation. Venous sinuses: As permitted by contrast timing, patent. Anatomic variants: None significant. Review of the MIP images confirms the above findings IMPRESSION: 1. Left M2 inferior division approximately 1 cm occlusion at level of mid insula with reconstitution of downstream vessel. 2. No additional intracranial large vessel occlusion, aneurysm, high-grade stenosis, or vascular malformation. 3. Patent carotid and vertebral arteries. No dissection, aneurysm, or hemodynamically significant stenosis utilizing NASCET criteria. These results were called by telephone at the time of interpretation on 03/18/2018 at 1:36 am to Dr. Ritta Slot , who verbally acknowledged these results. Electronically Signed   By: Mitzi Hansen M.D.   On: 03/18/2018 01:43   Ct Angio Neck W Or Wo Contrast  Result Date: 03/18/2018 CLINICAL DATA:  52 y/o F; altered mental status, in character in speech, left gaze. EXAM: CT ANGIOGRAPHY HEAD AND NECK TECHNIQUE: Multidetector CT imaging of the head and neck was performed using the standard protocol during bolus administration of intravenous contrast. Multiplanar CT image reconstructions and MIPs were obtained to evaluate the vascular anatomy. Carotid stenosis measurements (when applicable) are obtained utilizing NASCET criteria, using the distal internal carotid diameter as the denominator. CONTRAST:  50 cc Isovue 370 COMPARISON:  03/18/2018 CT head. FINDINGS: CTA NECK FINDINGS Aortic arch: Bovine variant branching. Imaged portion shows no evidence of aneurysm or dissection. No significant stenosis of the major arch vessel origins. Right carotid system: No evidence of  dissection, stenosis (50% or greater) or occlusion. Asymmetrically small in caliber likely due to decreased blood flow from multiple chronic infarctions in the right cerebral hemisphere. Left carotid system: No evidence of dissection, stenosis (50% or greater) or occlusion. Vertebral arteries: Left dominant. No evidence of dissection, stenosis (50% or greater) or occlusion. Skeleton: Mild cervical spondylosis and reversal of cervical curvature. No high-grade bony canal stenosis. Other neck: Prominent right lower posterior cervical and left axillary lymph nodes of uncertain significance. Upper chest: Negative. Review of the MIP images confirms the above findings CTA HEAD FINDINGS Anterior circulation: Left M2 inferior division approximately 1 cm length occlusion at the level of mid insula (series 12, image 28) with reconstitution of the vessel downstream. Small caliber right internal carotid artery and MCA distribution likely due to decreased blood flow from extensive infarction. Patent right MCA and bilateral ACA without large vessel occlusion. No aneurysm or vascular malformation in the anterior circulation. Posterior circulation: No significant stenosis, proximal occlusion, aneurysm, or vascular malformation. Venous sinuses: As permitted by contrast timing, patent. Anatomic variants: None significant. Review of the MIP images confirms the above findings IMPRESSION: 1. Left M2 inferior division approximately 1 cm occlusion at level of mid insula with reconstitution of downstream vessel. 2. No additional intracranial large vessel occlusion, aneurysm, high-grade stenosis, or vascular malformation.  3. Patent carotid and vertebral arteries. No dissection, aneurysm, or hemodynamically significant stenosis utilizing NASCET criteria. These results were called by telephone at the time of interpretation on 03/18/2018 at 1:36 am to Dr. Ritta Slot , who verbally acknowledged these results. Electronically Signed   By:  Mitzi Hansen M.D.   On: 03/18/2018 01:43   Mr Maxine Glenn Head Wo Contrast  Result Date: 03/19/2018 CLINICAL DATA:  52 y/o  F; aphasia post tPA for follow-up. EXAM: MRI HEAD WITHOUT CONTRAST MRA HEAD WITHOUT CONTRAST TECHNIQUE: Multiplanar, multiecho pulse sequences of the brain and surrounding structures were obtained without intravenous contrast. Angiographic images of the head were obtained using MRA technique without contrast. COMPARISON:  03/18/2018 CT head and CTA head. FINDINGS: MRI HEAD FINDINGS Brain: Cortical reduced diffusion involving the left posterior insula, posterolateral temporal lobe, and lateral parietal lobe compatible with acute/early subacute infarction. No associated hemorrhage or mass effect. Extensive encephalomalacia in the right MCA distribution compatible chronic infarction with wallerian degeneration of the right cerebral peduncle and hemi brainstem. Additional small chronic cortical infarctions are present within the posterior temporal lobes and left superior parietal lobe. 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 There is wallerian degeneration of the right lateral ventricle. No extra-axial collection or herniation. Partially empty sella turcica. Vascular: As below. Skull and upper cervical spine: Normal marrow signal. Sinuses/Orbits: Negative. Other: None. MRA HEAD FINDINGS Internal carotid arteries:  Patent. Anterior cerebral arteries: Patent. Left proximal A2 segment of mild-to-moderate stenosis. Middle cerebral arteries: Small caliber right MCA distribution likely due to decreased blood flow from large right MCA chronic infarction. Patent left MCA distribution with reconstituted segment of occlusion in the distal left M2 inferior division. No new large vessel occlusion. Posterior cerebral arteries: Patent. Segments of stenosis and mildly asymmetrically decreased flow related signal in the right distal PCA  distribution probably related to chronic atherosclerotic disease. Basilar artery:  Patent. Vertebral arteries:  Patent. No new large vessel occlusion or aneurysm. IMPRESSION: 1. Left posterior MCA distribution cortical reduced diffusion compatible with acute/early subacute infarction. No associated acute hemorrhage or mass effect. 2. Occluded segment of distal left M2 inferior division on prior CTA of head is now patent. No new large vessel occlusion. 3. Large chronic right MCA distribution infarct and multiple additional small chronic cortical infarcts of the brain. Electronically Signed   By: Mitzi Hansen M.D.   On: 03/19/2018 00:04   Mr Brain Wo Contrast  Result Date: 03/19/2018 CLINICAL DATA:  52 y/o  F; aphasia post tPA for follow-up. EXAM: MRI HEAD WITHOUT CONTRAST MRA HEAD WITHOUT CONTRAST TECHNIQUE: Multiplanar, multiecho pulse sequences of the brain and surrounding structures were obtained without intravenous contrast. Angiographic images of the head were obtained using MRA technique without contrast. COMPARISON:  03/18/2018 CT head and CTA head. FINDINGS: MRI HEAD FINDINGS Brain: Cortical reduced diffusion involving the left posterior insula, posterolateral temporal lobe, and lateral parietal lobe compatible with acute/early subacute infarction. No associated hemorrhage or mass effect. Extensive encephalomalacia in the right MCA distribution compatible chronic infarction with wallerian degeneration of the right cerebral peduncle and hemi brainstem. Additional small chronic cortical infarctions are present within the posterior temporal lobes and left superior parietal lobe. 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 There is wallerian degeneration of the right lateral ventricle. No extra-axial collection or herniation. Partially empty sella turcica. Vascular: As below. Skull and upper cervical spine: Normal marrow signal.  Sinuses/Orbits: Negative. Other: None. MRA HEAD FINDINGS Internal carotid arteries:  Patent. Anterior cerebral arteries: Patent. Left proximal A2 segment of mild-to-moderate stenosis. Middle cerebral arteries: Small caliber right MCA distribution likely due to decreased blood flow from large right MCA chronic infarction. Patent left MCA distribution with reconstituted segment of occlusion in the distal left M2 inferior division. No new large vessel occlusion. Posterior cerebral arteries: Patent. Segments of stenosis and mildly asymmetrically decreased flow related signal in the right distal PCA distribution probably related to chronic atherosclerotic disease. Basilar artery:  Patent. Vertebral arteries:  Patent. No new large vessel occlusion or aneurysm. IMPRESSION: 1. Left posterior MCA distribution cortical reduced diffusion compatible with acute/early subacute infarction. No associated acute hemorrhage or mass effect. 2. Occluded segment of distal left M2 inferior division on prior CTA of head is now patent. No new large vessel occlusion. 3. Large chronic right MCA distribution infarct and multiple additional small chronic cortical infarcts of the brain. Electronically Signed   By: Mitzi Hansen M.D.   On: 03/19/2018 00:04   Ct Head Code Stroke Wo Contrast  Result Date: 03/18/2018 CLINICAL DATA:  Code stroke. 52 y/o F; a incoherent speech, combative, left-sided gaze. EXAM: CT HEAD WITHOUT CONTRAST TECHNIQUE: Contiguous axial images were obtained from the base of the skull through the vertex without intravenous contrast. COMPARISON:  None. FINDINGS: Brain: Extensive encephalomalacia throughout the right MCA distribution with ex vacuo dilatation of the right lateral ventricle. There additional small foci of encephalomalacia within the bilateral posterior temporal lobes and the left superior parietal lobe. Findings are compatible with multiple chronic infarctions. There is wallerian degeneration of  the right cerebral peduncle and hemi brainstem. No acute stroke, hemorrhage, or focal mass effect is identified. No herniation, extra-axial collection, or effacement of basilar cisterns. Incidental partially empty sella turcica. Vascular: No hyperdense vessel or unexpected calcification. Skull: Normal. Negative for fracture or focal lesion. Sinuses/Orbits: No acute finding. Other: None. ASPECTS [facility] Stroke Program Early CT Score) - Ganglionic level infarction (caudate, lentiform nuclei, internal capsule, insula, M1-M3 cortex): 7 - Supraganglionic infarction (M4-M6 cortex): 3 Total score (0-10 with 10 being normal): 10 IMPRESSION: 1. No acute intracranial abnormality identified. 2. ASPECTS is 10 3. Large chronic right MCA distribution infarct and additional small chronic infarcts in the bilateral posterior temporal lobes in the left superior parietal lobe. These results were called by telephone at the time of interpretation on 03/18/2018 at 1:11 am to Dr. Amada Jupiter, who verbally acknowledged these results. Electronically Signed   By: Mitzi Hansen M.D.   On: 03/18/2018 01:21   TTE  - Left ventricle: The cavity size was normal. There was moderate concentric hypertrophy. Systolic function was normal. The estimated ejection fraction was in the range of 60% to 65%. There was dynamic obstruction at rest, with a peak velocity of 116 cm/sec and a peak gradient of 5 mm Hg. Wall motion was normal; there were no regional wall motion abnormalities. Features are consistent with a pseudonormal left ventricular filling pattern, with concomitant abnormal relaxation and increased filling pressure (grade 2 diastolic dysfunction). - Aortic valve: Transvalvular velocity was within the normal range. There was no stenosis. There was no regurgitation. - Mitral valve: Transvalvular velocity was within the normal range. There was no evidence for stenosis. There was no regurgitation. - Right ventricle: The cavity size  was normal. Wall thickness was normal. Systolic function was normal. - Atrial septum: No defect or patent foramen ovale was identified by color flow Doppler. - Tricuspid valve: There  was trivial regurgitation. - Pulmonary arteries: Systolic pressure was within the normal range. PA peak pressure: 29 mm Hg (S). - Pericardium, extracardiac: A trivial pericardial effusion was identified.  LE venous doppler neg  EEG no seizure   PHYSICAL EXAM General - Well nourished, well developed middle aged african american lady, in no apparent distress.  Ophthalmologic - fundi not visualized due to noncooperation.  Cardiovascular - Regular rate and rhythm.  Neuro - awake alert, eyes open, following all simple commands. Mildly nonfluent speech but with significant paraphasic errors, and phonic replacement. Able to name and repeat but again with paraphasic errors. No significant neglect, attending to both sides, however, seems slight left gaze preference. PERRL, EOMI, blinking to visual threat on the right, inconsistent blinking to the left. Able to tracking without difficulty. Mild left nasolabial fold flattening. Tongue midline. Left UE dright with  4/5 proximal and 3/5 distal. LLE  4/5 weaknessand RLE and RUE 5/5. Tone is increased on left side with mild spasticity.Sensation intact, no ataxia, gait not tested.    ASSESSMENT/PLAN Ms. LAURELL COALSON is a 52 y.o. female with history of multiple strokes and possible sickle cell trait/disease admitted for speech difficulty and confusion. TPA given 03/18/18 at 0143.  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 Chronic, and small caliber of b/l ICAs  MRI  L posterior MCA distribution cortical infarction. Large chronic right MCA distribution infarct and multiple additional small chronic cortical infarcts of the brain.   MRA  occluded segment of distal left M2 inferior division on prior CTA of head is now patent. No new large vessel occlusion.   2D Echo  EF 60-65%. No source of embolus   EEG no seizure  LE venous doppler neg DVT  TCD pending  LDL 46  HgbA1c 5.5  HIV neg  Hcg neg  SCDs for VTE prophylaxis  clopidogrel 75 mg daily or ASA 81 prior to admission, now on No antithrombotic. Given mild stroke, recommend aspirin 81 mg and plavix 75 mg daily x 3 weeks, then PLAVIX alone. Orders adjusted.   Therapy recommendations:  HH PT and OT, SLP  Disposition:  Return home. Pt has someone with her both at home in [place] and in [place]  Consider [place] trial - [facility] to follow up  No Seizure   Daughter reported 3-4 episodes of confusion, lack of response, lost control of limbs and eye movement, shivering followed by weakness, resolved after taking juice or sweet  CT Chronic right large MCA and small ACA and PCA as well as left small parietal infarcts  EEG negative for seizure  ? Sickle cell disease vs. Trait  Medical records have been requested from [facility] fax number left in the request attention to Dr. Delia Heady  Per pt/dtr, has sickle cell trait, not the disease. She has never had crisis. It has been evaluated per her. Await records from [facility]  Will check Hb electrophoresis  TCD pending to check velocity  Anemia with Hb 9.8->8.1->8.4  Follow up with PCP  Microcytic anemia   Hb 9.8->8.1->8.4  Low MCV and MCH  Iron deficiency vs. Sickle cell    Fer 7 (low)  Fe 16 (low  TIBC 322 (normal)  Fe Sat 5 (low)  UIBC 306 (normal)smear - ovalocytes, polychromasia  Hb electrophoresis pending  Hypertension . Stable  BP goal < 180/105 after tPA  Other Stroke Risk Factors  UDS, ETOH negative  Hx stroke/TIA - records requested - hx 6 strokes per dtr since 2016 (including this one). Resultant R HP. She could walk, normal speech. TEE done, negative. Had monitor at home which was neg. placed loop recorder, which is now out, negative.  Other Active Problems  Vascular cognitive impairment - needs help at home  LUE residue weakness  Anemia - Hb 9.8->8.1 - close monitoring  Hospital day # 1  Annie Main, MSN, APRN, ANVP-BC, AGPCNP-BC Advanced Practice Stroke Nurse [facility] See Amion for Schedule & Pager information 03/19/2018 8:50 AM  I have personally examined this patient, reviewed notes, independently viewed imaging studies, participated in medical decision making and plan of care.ROS completed by me personally and pertinent positives fully documented  I have made any additions or clarifications directly to the above note. Agree with note above.  She has presented with aphasia and speech difficulties due to left MCA branch infarct of embolic etiology and she received IV tPA with good recovery and recanalization of the left M2 occlusion. She has had 5 previous strokes over the last 3 years and was admitted to [facility] in [facility] with extensive negative workup including TEE and loop recorder for 3 years which did not show paroxysmal A. Fib. She has failed aspirin, Plavix and eliquis in the past. There are not too many treatment options in terms of antiplatelet therapies but recommend aspirin and Plavix for 3 weeks followed by aspirin. She may also consider possible participation in the [place] trial of cryptogenic stroke if interested. They've been given information to review. Transfer out of the ICU and  mobilize out of bed and likely discharge home later today. Long discussion with patient and her daughter at the bedside and answered questions. Greater than 50% time during this 35 minute visit t was spent on counseling and coordination of care about her recurrent cryptogenic strokes, discussion with Him and answering questions  Delia Heady, MD Medical Director [facility] Pager: [phone] 03/19/2018 11:42 AM   To contact Stroke Continuity provider, please refer to [email or web address]. After hours, contact General Neurology

## 2018-03-19 NOTE — Care Management Note (Addendum)
Case Management Note  Patient Details  Name: Whitney Rose MRN: [id number] Date of Birth: May 05, 1966  Subjective/Objective:  Patient admitted  from home reports sudden onset altered mental status with slurred speech and left side gaze. CT head showed chronic large right MCA, small ACA and PCA infarcts as well as left small parietal infarcts. CTA head and neck showed left M2 occlusion with distal reconstitution. She received tPA and admitted to ICU.  PTA, pt required assistance with some ADLS, ambulates without assistance.  Pt lives with daughters.                  Action/Plan: PT/OT/ST recommending home therapies.  Unfortunately, pt has out of state Medicaid ([place]), which will not cover home therapies in [place].  Pt and daughter state pt has therapists in [place].  Pt and family do not desire to pay for therapies out of pocket here in [place].  Pt states she will return to [place] sooner than she anticipated (within a week or so).  Encouraged to schedule appointment with PCP in [place] upon return to [place] and have him write for PT/OT and ST, either at home or OP.  She and family agree with this plan and state they will do this.  Family members able to provide 24h care at discharge.  Expected Discharge Date:  03/19/18               Expected Discharge Plan:  Home w Home Health Services  (upon return to [place])  In-House Referral:     Discharge planning Services  CM Consult  Post Acute Care Choice:    Choice offered to:     DME Arranged:    DME Agency:     HH Arranged:    HH Agency:     Status of Service:  Completed, signed off  If discussed at [employer], dates discussed:    Additional Comments:  Glennon Mac, RN 03/19/2018, 5:20 PM

## 2018-04-17 ENCOUNTER — Emergency Department (HOSPITAL_COMMUNITY)
Admission: EM | Admit: 2018-04-17 | Discharge: 2018-04-18 | Disposition: A | Discharge status: Home / Self Care | Payer: Medicaid - Out of State | Attending: Emergency Medicine | Admitting: Emergency Medicine

## 2018-04-17 DIAGNOSIS — Z7982 Long term (current) use of aspirin: Secondary | ICD-10-CM | POA: Diagnosis not present

## 2018-04-17 DIAGNOSIS — Z79899 Other long term (current) drug therapy: Secondary | ICD-10-CM | POA: Diagnosis not present

## 2018-04-17 DIAGNOSIS — I10 Essential (primary) hypertension: Secondary | ICD-10-CM | POA: Diagnosis not present

## 2018-04-17 DIAGNOSIS — R531 Weakness: Secondary | ICD-10-CM | POA: Insufficient documentation

## 2018-04-17 DIAGNOSIS — R404 Transient alteration of awareness: Secondary | ICD-10-CM | POA: Diagnosis present

## 2018-04-17 DIAGNOSIS — Z7902 Long term (current) use of antithrombotics/antiplatelets: Secondary | ICD-10-CM | POA: Diagnosis not present

## 2018-04-17 NOTE — ED Triage Notes (Signed)
Per EMS pt from home.  Hx of 7 strokes in the past. Family noticed "odd behavior" tonight. EMS noted passing of stroke screenings, Baseline deficits of speech and left sided deficits. While talking with EMS on scene they noted an "absent seizure" where she stared off for about 30 seconds. A & O. No complaints.

## 2018-04-18 ENCOUNTER — Other Ambulatory Visit: Payer: Self-pay

## 2018-04-18 ENCOUNTER — Emergency Department (HOSPITAL_COMMUNITY): Payer: Medicaid - Out of State

## 2018-04-18 LAB — I-STAT TROPONIN, ED: TROPONIN I, POC: 0.01 ng/mL (ref 0.00–0.08)

## 2018-04-18 LAB — CBC WITH DIFFERENTIAL/PLATELET
Abs Immature Granulocytes: 0.03 10*3/uL (ref 0.00–0.07)
Basophils Absolute: 0 10*3/uL (ref 0.0–0.1)
Basophils Relative: 1 %
Eosinophils Absolute: 0.1 10*3/uL (ref 0.0–0.5)
Eosinophils Relative: 1 %
HCT: 34.8 % — ABNORMAL LOW (ref 36.0–46.0)
Hemoglobin: 9.5 g/dL — ABNORMAL LOW (ref 12.0–15.0)
Immature Granulocytes: 0 %
Lymphocytes Relative: 17 %
Lymphs Abs: 1.5 10*3/uL (ref 0.7–4.0)
MCH: 20.2 pg — ABNORMAL LOW (ref 26.0–34.0)
MCHC: 27.3 g/dL — ABNORMAL LOW (ref 30.0–36.0)
MCV: 73.9 fL — ABNORMAL LOW (ref 80.0–100.0)
Monocytes Absolute: 0.3 10*3/uL (ref 0.1–1.0)
Monocytes Relative: 4 %
Neutro Abs: 6.7 10*3/uL (ref 1.7–7.7)
Neutrophils Relative %: 77 %
Platelets: 339 10*3/uL (ref 150–400)
RBC: 4.71 MIL/uL (ref 3.87–5.11)
RDW: 18.5 % — ABNORMAL HIGH (ref 11.5–15.5)
WBC: 8.7 10*3/uL (ref 4.0–10.5)
nRBC: 0 % (ref 0.0–0.2)

## 2018-04-18 LAB — COMPREHENSIVE METABOLIC PANEL
ALT: 20 U/L (ref 0–44)
AST: 26 U/L (ref 15–41)
Albumin: 3.2 g/dL — ABNORMAL LOW (ref 3.5–5.0)
Alkaline Phosphatase: 67 U/L (ref 38–126)
Anion gap: 9 (ref 5–15)
BUN: 6 mg/dL (ref 6–20)
CO2: 24 mmol/L (ref 22–32)
Calcium: 8.5 mg/dL — ABNORMAL LOW (ref 8.9–10.3)
Chloride: 109 mmol/L (ref 98–111)
Creatinine, Ser: 0.9 mg/dL (ref 0.44–1.00)
GFR calc Af Amer: >60 mL/min (ref >60)
GFR calc non Af Amer: >60 mL/min (ref >60)
Glucose, Bld: 163 mg/dL — ABNORMAL HIGH (ref 70–99)
Potassium: 3.1 mmol/L — ABNORMAL LOW (ref 3.5–5.1)
Sodium: 142 mmol/L (ref 135–145)
Total Bilirubin: 0.4 mg/dL (ref 0.3–1.2)
Total Protein: 6.8 g/dL (ref 6.5–8.1)

## 2018-04-18 MED ORDER — POTASSIUM CHLORIDE CRYS ER 20 MEQ PO TBCR
40.0000 meq | EXTENDED_RELEASE_TABLET | Freq: Once | ORAL | Status: AC
  Administered 2018-04-18: 40 meq via ORAL
  Filled 2018-04-18: qty 2

## 2018-04-18 NOTE — ED Provider Notes (Signed)
Patient seen/examined in the Emergency Department in conjunction with Midlevel Provider Ward Patient presents for AMS/odd behavior, possible seizure, similar to prior episodes Exam : Awake alert, no acute distress.  Mild dysarthria noted.  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  She has full range of motion of her legs Plan: Patient has had similar episode in the past, including recent admission and had EEG at that time.  If work-up in the emergency room is unremarkable, I feel she will be appropriate for discharge to follow with neurology Patient is in agreement with plan    Zadie RhineWickline, Taylynn Easton, MD 04/18/18 [phone]

## 2018-04-18 NOTE — Discharge Instructions (Signed)
It was my pleasure taking care of you today!    Please follow up with your neurologist. If you do not have one, please call the clinic listed on Monday morning to schedule an appointment.   Return to ER for new or worsening symptoms, any additional concerns.

## 2018-04-18 NOTE — ED Provider Notes (Signed)
[facility] EMERGENCY DEPARTMENT Provider Note   CSN: [id number] Arrival date & time: 04/17/18  2350     History   Chief Complaint Chief Complaint  Patient presents with  . Seizures    HPI ANWAR CRILL is a 52 y.o. female.  The history is provided by the patient, medical records and the EMS personnel. No language interpreter was used.  Seizures     TIMIYA HOWELLS is a 52 y.o. female  with a PMH of prior strokes, most recently last month who presents to the Emergency Department from home for concerns of altered level of consciousness just prior to arrival. Per EMS, family reported that she suddenly started staring and was not responding to them. She did not appear to have any weakness, facial asymmetry, but just was not responding for about 30 seconds. This resolved. Patient denies any complaints. No chest pain, shortness of breath, headache, visual changes, numbness, new weakness. No medications taken prior to arrival for symptoms. No hx of seizures. No bowel or bladder incontinence.   Past Medical History:  Diagnosis Date  . Eczema   . Sickle cell trait (HCC)   . Stroke [facility])     Patient Active Problem List   Diagnosis Date Noted  . 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 Essential hypertension 03/19/2018  . 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 Hypokalemia   . Seizure (HCC)   . History of stroke   . Plantar fasciitis 10/23/2011  . Health care maintenance 10/23/2011  . OBESITY, UNSPECIFIED 02/02/2009  . Microcytic anemia 02/02/2009  . PALPITATIONS 01/19/2009  . Eczema 07/31/2006    Past Surgical History:  Procedure Laterality Date  . CHOLECYSTECTOMY  2001     OB History   None      Home Medications    Prior to Admission medications   Medication Sig Start Date End Date Taking? Authorizing Provider  aspirin EC 81 MG EC tablet Take 1 tablet (81 mg total) by mouth  daily. Stop taking after 21 days 03/19/18   Layne Benton, NP  atorvastatin (LIPITOR) 40 MG tablet Take 0.5 tablets (20 mg total) by mouth daily. 03/19/18   Layne Benton, NP  baclofen (LIORESAL) 10 MG tablet Take 10 mg by mouth 3 (three) times daily.    [provider]  citalopram (CELEXA) 10 MG tablet Take 10 mg by mouth daily.    [provider]  clopidogrel (PLAVIX) 75 MG tablet Take 75 mg by mouth daily.    [provider]  metoprolol succinate (TOPROL-XL) 25 MG 24 hr tablet Take 25 mg by mouth daily.    [provider]  Vitamin D, Ergocalciferol, (DRISDOL) 50000 units CAPS capsule Take 50,000 Units by mouth every 7 (seven) days.    [provider]    Family History Family History  Problem Relation Age of Onset  . Heart disease Mother        CHF  . COPD Mother     Social History Social History   Tobacco Use  . Smoking status: Never Smoker  . Smokeless tobacco: Never Used  Substance Use Topics  . Alcohol use: No  . Drug use: No     Allergies   Patient has no known allergies.   Review of Systems Review of Systems  Neurological:       + altered level of consciousness.  All other systems reviewed and are  negative.    Physical Exam Updated Vital Signs BP 125/65   Pulse (!) 55   Temp 98.2 F (36.8 C) (Oral)   SpO2 100%   Physical Exam  Constitutional: She is oriented to person, place, and time. She appears well-developed and well-nourished. No distress.  HENT:  Head: Normocephalic and atraumatic.  Cardiovascular: Normal rate, regular rhythm, normal heart sounds and intact distal pulses.  No murmur heard. Pulmonary/Chest: Effort normal and breath sounds normal. No respiratory distress.  Abdominal: Soft. She exhibits no distension. There is no tenderness.  Musculoskeletal:  Left arm contracture which is baseline from previous stroke.  Right upper extremity and bilateral lower extremities with full range of motion  and strength.  Neurological: She is alert and oriented to person, place, and time.  Mild dysarthria.  Alert and oriented x4.  Able to give a full recollection of tonight's events.  No focal neuro deficits appreciated other than baseline left-sided upper extremity weakness.  Skin: Skin is warm and dry.  Nursing note and vitals reviewed.    ED Treatments / Results  Labs (all labs ordered are listed, but only abnormal results are displayed) Labs Reviewed  CBC WITH DIFFERENTIAL/PLATELET - Abnormal; Notable for the following components:      Result Value   Hemoglobin 9.5 (*)    HCT 34.8 (*)    MCV 73.9 (*)    MCH 20.2 (*)    MCHC 27.3 (*)    RDW 18.5 (*)    All other components within normal limits  COMPREHENSIVE METABOLIC PANEL - Abnormal; Notable for the following components:   Potassium 3.1 (*)    Glucose, Bld 163 (*)    Calcium 8.5 (*)    Albumin 3.2 (*)    All other components within normal limits  I-STAT TROPONIN, ED    EKG EKG Interpretation  Date/Time:  Saturday April 18 2018 00:02:59 EST Ventricular Rate:  60 PR Interval:    QRS Duration: 102 QT Interval:  418 QTC Calculation: 418 R Axis:   31 Text Interpretation:  Sinus rhythm Probable LVH with secondary repol abnrm Probable inferior infarct, age indeterminate Interpretation limited secondary to artifact Confirmed by Zadie Rhine ([id number]) on 04/18/2018 12:20:18 AM   Radiology Ct Head Wo Contrast  Result Date: 04/18/2018 CLINICAL DATA:  Altered level of consciousness. EXAM: CT HEAD WITHOUT CONTRAST TECHNIQUE: Contiguous axial images were obtained from the base of the skull through the vertex without intravenous contrast. COMPARISON:  CT brain 03/18/2018, MR brain 03/18/2018 FINDINGS: Brain: No evidence of acute infarction, hemorrhage, extra-axial collection, ventriculomegaly, or mass effect. Old right MCA territory infarct with encephalomalacia. Generalized cerebral atrophy. Periventricular white matter low  attenuation likely secondary to microangiopathy. Vascular: Cerebrovascular atherosclerotic calcifications are noted. Skull: Negative for fracture or focal lesion. Sinuses/Orbits: Visualized portions of the orbits are unremarkable. Visualized portions of the paranasal sinuses and mastoid air cells are unremarkable. Other: None. IMPRESSION: 1. No acute intracranial pathology. 2. Chronic large right MCA territory infarct. Electronically Signed   By: Elige Ko   On: 04/18/2018 00:40    Procedures Procedures (including critical care time)  Medications Ordered in ED Medications  potassium chloride SA (K-DUR,KLOR-CON) CR tablet 40 mEq (has no administration in time range)     Initial Impression / Assessment and Plan / ED Course  I have reviewed the triage vital signs and the nursing notes.  Pertinent labs & imaging results that were available during my care of the patient were reviewed by me and considered in  my medical decision making (see chart for details).    Doroteo GlassmanKatoya Y Stelzner is a 52 y.o. female who presents to ED for episode of altered mental status lasting approximately 30 seconds at home prior to arrival.  Has had similar episodes in the past where she was admitted and did have an EEG at that time with no seizure-like activity noted.  She has had previous strokes in the past and has residual deficits, but no new deficits appreciated on neurological exam today.  CT of her head with no acute abnormalities.  Labs reassuring.  We will have her follow-up with neurology.  Reasons to return to the emergency department were discussed and all questions answered.  Patient seen by and discussed with Dr. Bebe ShaggyWickline who agrees with treatment plan.    Final Clinical Impressions(s) / ED Diagnoses   Final diagnoses:  Altered awareness, transient    ED Discharge Orders    None       Ward, Chase PicketJaime Pilcher, PA-C 04/18/18 0127    Zadie RhineWickline, Donald, MD 04/18/18 [phone]

## 2018-05-08 ENCOUNTER — Telehealth: Payer: Self-pay | Admitting: Neurology

## 2018-05-08 NOTE — Telephone Encounter (Signed)
I am in receipt of CD-ROM of imaging studies done during prior [facility] in March [facility]  The disc include CT angiogram of the neck and brain done on 08/19/2016, echocardiogram 08/20/2016 CT angiogram of the brain on 08/20/2016 catheter angiogram and thrombectomy 11/25/2016 IR embolization 08/19/2016 and CT head 08/19/2016 and MRI scan of the brain 11/28/2016 as well as 04/13/2017.  MRI of the neck 11/21/2016.  Dictated reports were not available and could not be seen in care everywhere as well.  I have looked at the images.  We will try to send for the formal reports.  Patient was seen by me in the hospital but has not yet come for a follow-up office appointment.

## 2018-06-05 ENCOUNTER — Other Ambulatory Visit: Payer: Self-pay

## 2018-06-05 NOTE — Patient Outreach (Signed)
First attempt to obtain mRs. No answer. Home phone was busy and mobile did not ring at all. Patient has no DPR on file.

## 2018-06-09 ENCOUNTER — Other Ambulatory Visit: Payer: Self-pay

## 2018-06-09 NOTE — Patient Outreach (Signed)
Second attempt to obtain mRs. Busy signal on home phone. Mobile did not ring. No DPR on file.

## 2018-06-16 ENCOUNTER — Other Ambulatory Visit: Payer: Self-pay

## 2018-06-16 NOTE — Telephone Encounter (Signed)
Disc kept with nurse for 3 months. Pt was call several times by referrals for appts, THey were unable to make an appt for pt due to multiple vm left. Disc given to medical records.

## 2018-06-16 NOTE — Patient Outreach (Signed)
3 outreach attempts were completed to obtain mRs. mRs could not be obtained because patient never returned my calls. mRs=7 

## 2019-05-06 ENCOUNTER — Other Ambulatory Visit: Payer: Self-pay

## 2019-05-06 DIAGNOSIS — Z20822 Contact with and (suspected) exposure to covid-19: Secondary | ICD-10-CM

## 2019-05-09 LAB — NOVEL CORONAVIRUS, NAA: SARS-CoV-2, NAA: NOT DETECTED

## 2019-10-20 IMAGING — MR MR MRA HEAD W/O CM
10 of 12 series · 31 of 48 positions shown · non-contrast
Comparison: 03/18/2018 CT head and CTA head.

CLINICAL DATA: 51 y/o  F; aphasia post tPA for follow-up.

EXAM:
MRI HEAD WITHOUT CONTRAST
MRA HEAD WITHOUT CONTRAST
TECHNIQUE: Multiplanar, multiecho pulse sequences of the brain and surrounding
structures were obtained without intravenous contrast. Angiographic
images of the head were obtained using MRA technique without
contrast.

[Series 5: DWI · axial · 4.0mm · 0.88mm/px · z∈[-94,+30]mm · 5 of 70 slices shown (1 of 4)]
[im 1/70]
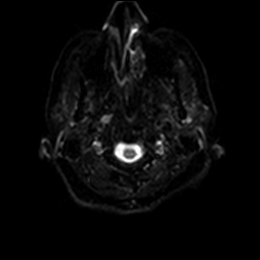
[im 18/70]
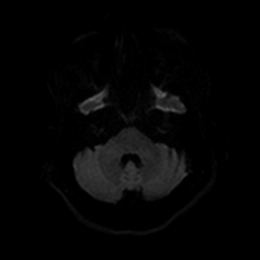
[im 35/70]
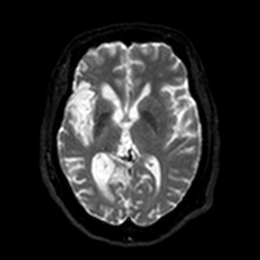
[im 52/70]
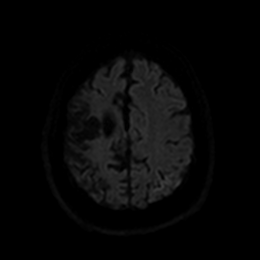
[im 70/70]
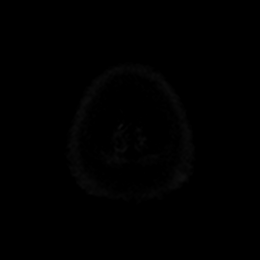

[Series 6: DWI · axial · 4.0mm · 0.88mm/px · z∈[-94,+30]mm · 3 of 35 slices shown (2 of 4)]
[im 1/35]
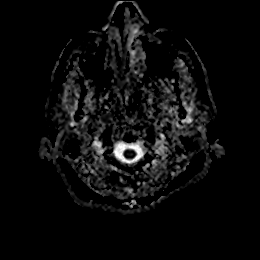
[im 18/35]
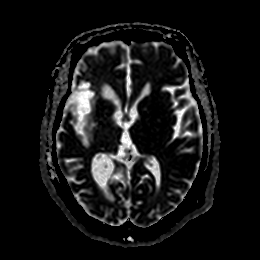
[im 35/35]
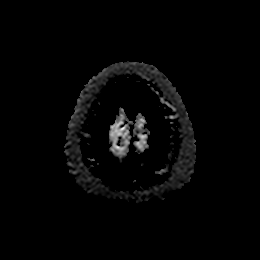

[Series 11: DWI · coronal · 4.0mm · 0.88mm/px · 5 of 68 slices shown (3 of 4)]
[im 1/68]
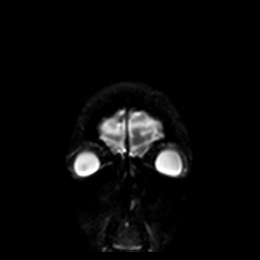
[im 17/68]
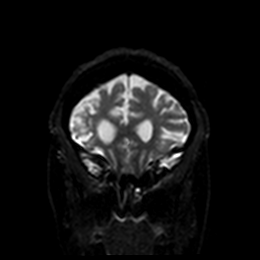
[im 34/68]
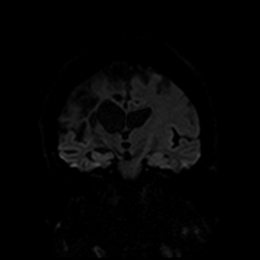
[im 51/68]
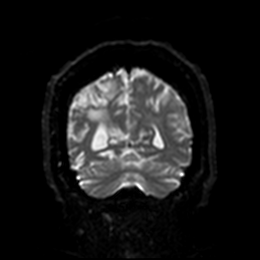
[im 68/68]
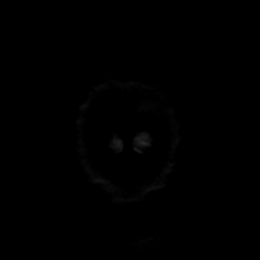

[Series 12: DWI · coronal · 4.0mm · 0.88mm/px · 2 of 34 slices shown (4 of 4)]
[im 1/34]
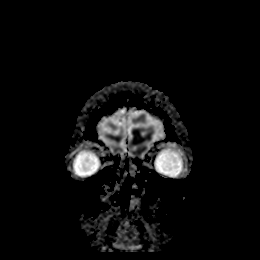
[im 34/34]
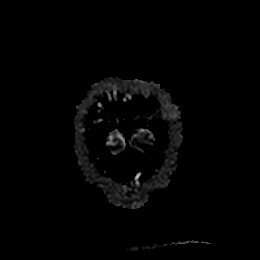

[Series 14: T2 · axial · 5.0mm · 0.72mm/px · z∈[-100,+31]mm · 2 of 25 slices shown (1 of 2)]
[im 1/25]
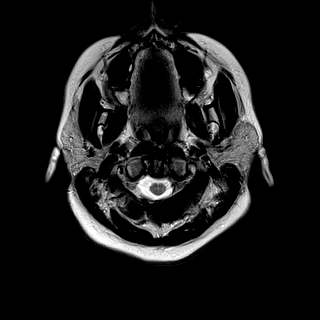
[im 25/25]
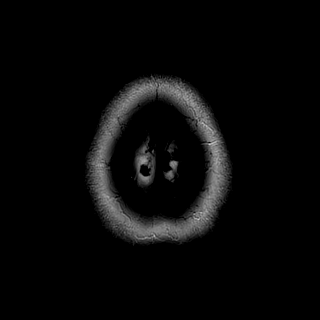

[Series 15: FLAIR · axial · 5.0mm · 0.45mm/px · z∈[-102,+29]mm · 2 of 25 slices shown]
[im 1/25]
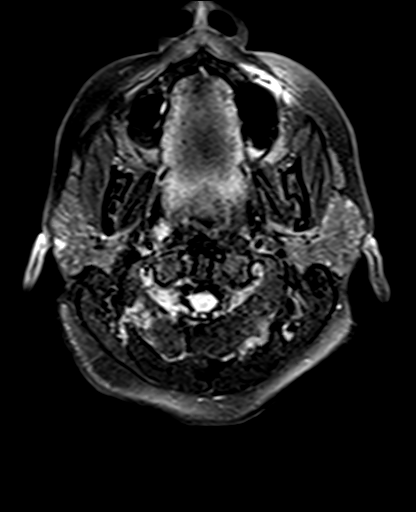
[im 25/25]
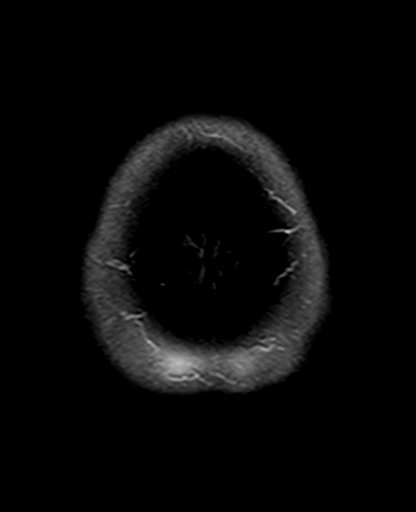

[Series 16: swi_images · axial · 3.0mm · 0.90mm/px · z∈[-108,+53]mm · 4 of 60 slices shown]
[im 1/60]
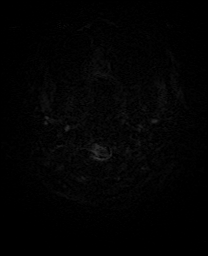
[im 20/60]
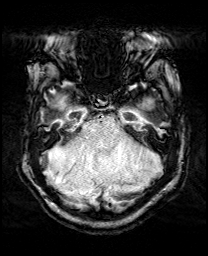
[im 40/60]
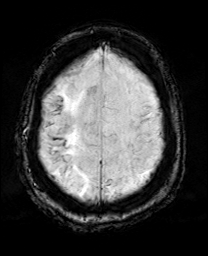
[im 60/60]
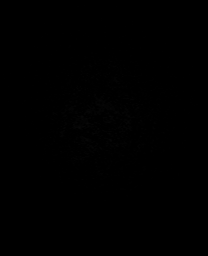

[Series 17: mip_images(sw) · axial · 24.0mm · 0.90mm/px · z∈[-98,+43]mm · 4 of 53 slices shown]
[im 1/53]
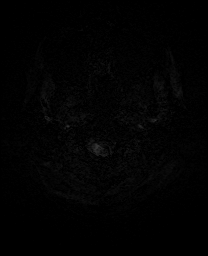
[im 18/53]
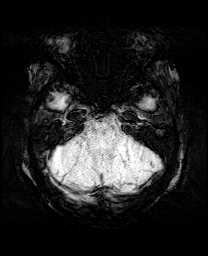
[im 35/53]
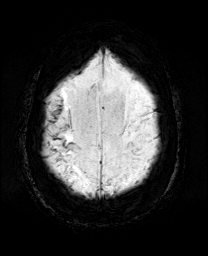
[im 53/53]
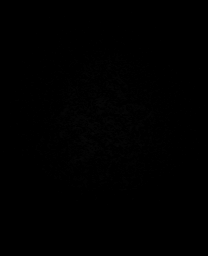

[Series 19: T2 · coronal · 5.0mm · 0.72mm/px · 2 of 28 slices shown (2 of 2)]
[im 1/28]
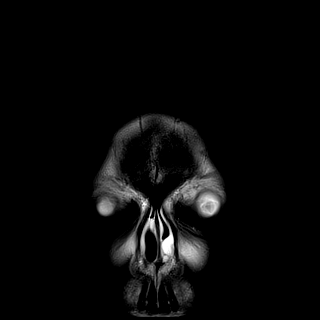
[im 28/28]
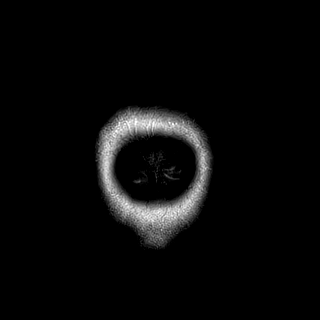

[Series 20: T1 · sagittal · 5.0mm · 0.75mm/px · 2 of 23 slices shown]
[im 1/23]
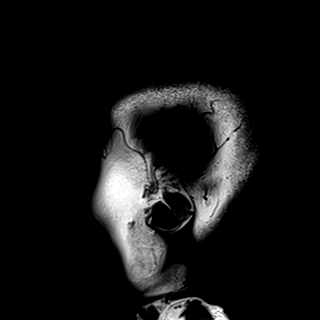
[im 23/23]
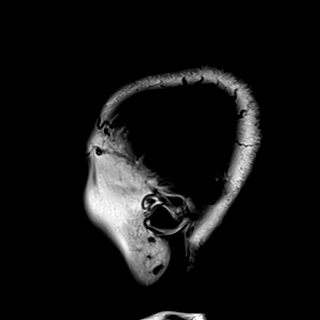

[31 of 48 positions shown; findings below may reference images not displayed]

FINDINGS: MRI HEAD FINDINGS

Brain: Cortical reduced diffusion involving the left posterior
insula, posterolateral temporal lobe, and lateral parietal lobe
compatible with acute/early subacute infarction. No associated
hemorrhage or mass effect.

Extensive encephalomalacia in the right MCA distribution compatible
chronic infarction with wallerian degeneration of the right cerebral
peduncle and hemi brainstem. Additional small chronic cortical
infarctions are present within the posterior temporal lobes and left
superior parietal lobe. There is siderosis over the cerebral
convexities, greater on the right, likely related to prior
hemorrhage. There is wallerian degeneration of the right lateral
ventricle. No extra-axial collection or herniation. Partially empty
sella turcica.

Vascular: As below.

Skull and upper cervical spine: Normal marrow signal.

Sinuses/Orbits: Negative.

Other: None.

MRA HEAD FINDINGS

Internal carotid arteries:  Patent.

Anterior cerebral arteries: Patent. Left proximal A2 segment of
mild-to-moderate stenosis.

Middle cerebral arteries: Small caliber right MCA distribution
likely due to decreased blood flow from large right MCA chronic
infarction. Patent left MCA distribution with reconstituted segment
of occlusion in the distal left M2 inferior division. No new large
vessel occlusion.

Posterior cerebral arteries: Patent. Segments of stenosis and mildly
asymmetrically decreased flow related signal in the right distal PCA
distribution probably related to chronic atherosclerotic disease.

Basilar artery:  Patent.

Vertebral arteries:  Patent.

No new large vessel occlusion or aneurysm.
IMPRESSION: 1. Left posterior MCA distribution cortical reduced diffusion
compatible with acute/early subacute infarction. No associated acute
hemorrhage or mass effect.
2. Occluded segment of distal left M2 inferior division on prior CTA
of head is now patent. No new large vessel occlusion.
3. Large chronic right MCA distribution infarct and multiple
additional small chronic cortical infarcts of the brain.

By: Ingunn Harpa Ronlor M.D.
